# Patient Record
Sex: Female | Born: 1980 | Race: Black or African American | Hispanic: No | Marital: Single | Smoking: Former smoker
Health system: Southern US, Community
[De-identification: ages and names within clinical notes are randomized; demographics above are authoritative.]

## PROBLEM LIST (undated history)

## (undated) ENCOUNTER — Emergency Department (HOSPITAL_COMMUNITY): Disposition: A | Discharge status: Other Acute Inpatient Hospital | Payer: Medicaid Other

## (undated) HISTORY — PX: TUBAL LIGATION: SHX77

---
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
## 2002-06-13 ENCOUNTER — Emergency Department (HOSPITAL_COMMUNITY): Admission: EM | Admit: 2002-06-13 | Discharge: 2002-06-13 | Payer: Self-pay | Admitting: Emergency Medicine

## 2002-12-26 ENCOUNTER — Emergency Department (HOSPITAL_COMMUNITY): Admission: EM | Admit: 2002-12-26 | Discharge: 2002-12-26 | Payer: Self-pay | Admitting: *Deleted

## 2003-05-30 ENCOUNTER — Emergency Department (HOSPITAL_COMMUNITY): Admission: EM | Admit: 2003-05-30 | Discharge: 2003-05-30 | Payer: Self-pay | Admitting: Emergency Medicine

## 2003-06-17 ENCOUNTER — Emergency Department (HOSPITAL_COMMUNITY): Admission: EM | Admit: 2003-06-17 | Discharge: 2003-06-17 | Payer: Self-pay | Admitting: Emergency Medicine

## 2004-07-20 ENCOUNTER — Emergency Department (HOSPITAL_COMMUNITY): Admission: EM | Admit: 2004-07-20 | Discharge: 2004-07-20 | Payer: Self-pay | Admitting: Emergency Medicine

## 2004-12-10 ENCOUNTER — Ambulatory Visit (HOSPITAL_COMMUNITY): Admission: RE | Admit: 2004-12-10 | Discharge: 2004-12-11 | Payer: Self-pay | Admitting: Obstetrics and Gynecology

## 2005-01-04 ENCOUNTER — Inpatient Hospital Stay (HOSPITAL_COMMUNITY): Admission: AD | Admit: 2005-01-04 | Discharge: 2005-01-08 | Payer: Self-pay | Admitting: Obstetrics and Gynecology

## 2006-04-21 ENCOUNTER — Emergency Department (HOSPITAL_COMMUNITY): Admission: EM | Admit: 2006-04-21 | Discharge: 2006-04-21 | Payer: Self-pay | Admitting: Emergency Medicine

## 2006-07-25 ENCOUNTER — Inpatient Hospital Stay (HOSPITAL_COMMUNITY): Admission: AD | Admit: 2006-07-25 | Discharge: 2006-07-27 | Payer: Self-pay | Admitting: Obstetrics and Gynecology

## 2006-09-07 ENCOUNTER — Ambulatory Visit (HOSPITAL_COMMUNITY): Admission: RE | Admit: 2006-09-07 | Discharge: 2006-09-07 | Payer: Self-pay | Admitting: Obstetrics & Gynecology

## 2006-09-28 ENCOUNTER — Emergency Department (HOSPITAL_COMMUNITY): Admission: EM | Admit: 2006-09-28 | Discharge: 2006-09-28 | Payer: Self-pay | Admitting: Emergency Medicine

## 2007-10-30 ENCOUNTER — Emergency Department (HOSPITAL_COMMUNITY): Admission: EM | Admit: 2007-10-30 | Discharge: 2007-10-30 | Payer: Self-pay | Admitting: Emergency Medicine

## 2008-05-21 ENCOUNTER — Emergency Department (HOSPITAL_COMMUNITY): Admission: EM | Admit: 2008-05-21 | Discharge: 2008-05-21 | Payer: Self-pay | Admitting: Emergency Medicine

## 2008-12-05 ENCOUNTER — Emergency Department (HOSPITAL_COMMUNITY): Admission: EM | Admit: 2008-12-05 | Discharge: 2008-12-05 | Payer: Self-pay | Admitting: Emergency Medicine

## 2009-04-03 ENCOUNTER — Emergency Department (HOSPITAL_COMMUNITY): Admission: EM | Admit: 2009-04-03 | Discharge: 2009-04-03 | Payer: Self-pay | Admitting: Emergency Medicine

## 2009-09-10 ENCOUNTER — Emergency Department (HOSPITAL_COMMUNITY): Admission: EM | Admit: 2009-09-10 | Discharge: 2009-09-10 | Payer: Self-pay | Admitting: Emergency Medicine

## 2009-09-22 ENCOUNTER — Ambulatory Visit: Payer: Self-pay | Admitting: Orthopedic Surgery

## 2009-09-22 ENCOUNTER — Ambulatory Visit (HOSPITAL_COMMUNITY): Admission: RE | Admit: 2009-09-22 | Discharge: 2009-09-22 | Payer: Self-pay | Admitting: Orthopedic Surgery

## 2009-09-22 DIAGNOSIS — S139XXA Sprain of joints and ligaments of unspecified parts of neck, initial encounter: Secondary | ICD-10-CM

## 2009-09-22 DIAGNOSIS — M549 Dorsalgia, unspecified: Secondary | ICD-10-CM | POA: Insufficient documentation

## 2009-09-29 ENCOUNTER — Ambulatory Visit: Payer: Self-pay | Admitting: Orthopedic Surgery

## 2009-09-30 ENCOUNTER — Telehealth: Payer: Self-pay | Admitting: Orthopedic Surgery

## 2009-10-10 ENCOUNTER — Telehealth: Payer: Self-pay | Admitting: Orthopedic Surgery

## 2010-03-24 ENCOUNTER — Encounter: Payer: Self-pay | Admitting: Orthopedic Surgery

## 2010-08-11 NOTE — Letter (Signed)
Summary: Xray order  Xray order   Imported By: Cammie Sickle 09/22/2009 09:07:13  _____________________________________________________________________  External Attachment:    Type:   Image     Comment:   External Document

## 2010-08-11 NOTE — Progress Notes (Signed)
Summary: Chiropractor referral.  Phone Note Outgoing Call   Call placed by: Waldon Reining,  September 30, 2009 11:09 AM Call placed to: Specialist Action Taken: Information Sent Summary of Call: I faxed a referral for this patient to Dr. Reatha Harps, Chiropractor.
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
## 2010-08-11 NOTE — Progress Notes (Signed)
Summary: Patient cannot be reached by Chiropractor.  Phone Note From Other Clinic   Summary of Call: Misty Airel Magadan at Dr. Lubertha Basque offic  called to say she has not scheduled Donna Miranda because she has not been able to contat her.  Has left messages but, patient has not returned her calls Initial call taken by: Jacklynn Ganong,  October 10, 2009 8:48 AM

## 2010-08-11 NOTE — Miscellaneous (Signed)
Summary: denied pain med for the 3rd time in a week  Clinical Lists Changes

## 2010-08-11 NOTE — Letter (Signed)
Summary: History form  History form   Imported By: Jacklynn Ganong 09/30/2009 11:41:55  _____________________________________________________________________  External Attachment:    Type:   Image     Comment:   External Document
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
## 2010-10-16 LAB — URINALYSIS, ROUTINE W REFLEX MICROSCOPIC
Glucose, UA: NEGATIVE mg/dL
Leukocytes, UA: NEGATIVE
Nitrite: NEGATIVE
Specific Gravity, Urine: 1.03 (ref 1.005–1.030)

## 2010-10-16 LAB — WET PREP, GENITAL: Yeast Wet Prep HPF POC: NONE SEEN

## 2010-10-16 LAB — URINE MICROSCOPIC-ADD ON

## 2010-10-16 LAB — GC/CHLAMYDIA PROBE AMP, GENITAL: Chlamydia, DNA Probe: NEGATIVE

## 2010-11-27 NOTE — Op Note (Signed)
Donna Miranda, Donna Miranda                  ACCOUNT NO.:  [id number]   MEDICAL RECORD NO.:  [id number]          PATIENT TYPE:  INP   LOCATION:  LDR1                          FACILITY:  [facility]   PHYSICIAN:  Tilda Burrow, M.D. DATE OF BIRTH:  10-01-1980   DATE OF PROCEDURE:  07/25/2006  DATE OF DISCHARGE:                               OPERATIVE REPORT   PROCEDURE:  Continuous lumbar epidural catheter placement.   Patient requested epidural and shortly, at approximately 12:45 p.m., an  epidural was placed after confirming patient's request and absence of  any questions regarding the epidural, she had one with her prior  pregnancy.  Loss of resistance technique used at L2-3 interspace,  identifying the epidural space on the first attempt using a Tuohy needle  at a depth of approximately 4.5 cm where the epidural space was  encountered.  A 3 mL test dose of Xylocaine with epinephrine was infused  followed by insertion of the epidural catheter 3 cm into the epidural  space.  An additional 2 mL test dose is injected of Xylocaine with  epinephrine through the epidural catheter with no change in maternal  status.  We then proceeded with continuous infusion with a 7 mL bolus  and 14 mL per hour with symmetric analgesic effect at T10 near the  umbilicus. The patient tolerated the procedure well and went to recovery  room in excellent condition.      Tilda Burrow, M.D.  Electronically Signed     JVF/MEDQ  D:  07/25/2006  T:  07/25/2006  Job:  [id number]

## 2010-11-27 NOTE — Op Note (Signed)
NAMEOVIYA, AMMAR                  ACCOUNT NO.:  [id number]   MEDICAL RECORD NO.:  [id number]          PATIENT TYPE:  INP   LOCATION:  LDR1                          FACILITY:  [facility]   PHYSICIAN:  Tilda Burrow, M.D. DATE OF BIRTH:  [birth year]   DATE OF PROCEDURE:  01/05/2005  DATE OF DISCHARGE:                                 OPERATIVE REPORT   PROCEDURE PERFORMED:  Epidural catheter placement.   Continuous lumbar epidural catheter attempted with the patient 7 cm dilated,  requesting epidural.   DESCRIPTION OF PROCEDURE:  Consent was obtained and the patient was placed  in the sitting position, flexed forward with back prepped and draped.  A  skin wheal was raised at L3-4 interspace, and epidural catheter needle, a 19  gauge Tuohy needle inserted and loss of resistance technique used.  Unfortunately, a wet tap was obtained a very superficial distance and so  that catheter discontinued at that space.  We proceeded with raising to the  next interspace level and again after placing pressure on the skin at the  old site for a couple of minutes, proceeded with catheter placement using  the same technique at a higher interspace.  The epidural space was  identified using loss of resistance technique at 2 cm depth beneath the  skin.  A 3 mL test dose was initiated followed by insertion of the epidural  catheter 3 cm into the epidural space and then upward oriented position.  The catheter was attached to the infusion bag and a 9 mL bolus initiated  followed by 12 mL per hour.  Analgesic effect is setting up at the time of  this dictation.       JVF/MEDQ  D:  01/05/2005  T:  01/05/2005  Job:  [id number]

## 2010-11-27 NOTE — Discharge Summary (Signed)
Donna Miranda, Donna Miranda                  ACCOUNT NO.:  [id number]   MEDICAL RECORD NO.:  [id number]          PATIENT TYPE:  INP   LOCATION:                                FACILITY:  [facility]   PHYSICIAN:  Tilda Burrow, M.D. DATE OF BIRTH:  Apr 23, 1981   DATE OF ADMISSION:  01/04/2005  DATE OF DISCHARGE:  06/30/2006LH                                 DISCHARGE SUMMARY   ADMISSION DIAGNOSES:  1.  Pregnancy 40 weeks and 4 days.  2.  Induction of labor.   DISCHARGE DIAGNOSIS:  Pregnancy 40 weeks and 4 days, delivered.   PROCEDURE:  1.  Foley bulb cervical ripening.  2.  Continuous lumbar epidural catheter placement by Tilda Burrow, M.D.  3.  Spontaneous, vertex, vaginal delivery by Zerita Boers, N.M.   COMPLICATIONS:  Wet tap to epidural not requiring blood patch.   HOSPITAL SUMMARY:  This 30 year old female was admitted on 06/26/yr for  induction of labor at 40 weeks 4 days.  She had Foley bulb induction over  night, active labor in response to oxytocin.  The epidural catheter was  placed at approximately 7 a.m. on 01/05/2005 and was complicated by wet tap  due to the patient's unusually superficial subarachnoid space.  The patient  had an effective epidural for labor management.  She delivered after a 1  hour, 11 minute second stage and 10 minute third stage with epidural  catheter removed intact.  The baby was a healthy female infant who did well.  Postpartum course was notable and the patient chose against getting an  epidural blood patch for the headache.  She found the headache manageable  and after a lengthy discussion on date of discharge she chose to attempt  conservative management with increasing fluids, caffeine, and Tylox p.r.n.  pain and Ambien for sleep with the patient having plenty of support at home.  Discharge hemoglobin was 8.3, hematocrit 24.8.  The patient will be on  Chromatin Forte as well.  Followup in 1 week for Depo-Provera and assessment  of  headache.       JVF/MEDQ  D:  01/08/2005  T:  01/08/2005  Job:  [id number]

## 2010-11-27 NOTE — Op Note (Signed)
Donna Miranda, LAHMAN                  ACCOUNT NO.:  [id number]   MEDICAL RECORD NO.:  [id number]          PATIENT TYPE:  AMB   LOCATION:  DAY                           FACILITY:  [facility]   PHYSICIAN:  Lazaro Arms, M.D.   DATE OF BIRTH:  February 14, 1981   DATE OF PROCEDURE:  09/07/2006  DATE OF DISCHARGE:                               OPERATIVE REPORT   DISCHARGE DIAGNOSIS:  Multiparous female, desires permanent  sterilization.   POSTOPERATIVE DIAGNOSIS:  Multiparous female, desires permanent  sterilization.   PROCEDURE:  Laparoscopic tubal ligation using electrocautery.   SURGEON:  Eure.   ANESTHESIA:  General endotracheal.   FINDINGS:  Normal uterus, tubes and ovaries, and pelvis.   DESCRIPTION OF OPERATION:  The patient was taken to the operating room,  placed in supine position, underwent general endotracheal anesthesia.  Placed in dorsal lithotomy supine position, prepped and draped in  sterile fashion.  Incision made in the umbilicus.  Veress needle was  used, placed in the peritoneal cavity with one pass without difficulty.  Intraperitoneal cavity was insufflated.  A nonbladed video laparoscope  trocar was placed under direct visualization without difficulty.  Peritoneal cavity was confirmed.  The fallopian tubes were identified.  Both fallopian tubes were burned in the distal isthmic ampullary region  of each tube, 2.5-cm segments bilaterally.  There was good hemostasis  bilaterally.  Peritoneal contents were otherwise normal.  Pelvic  contents were normal.  The instruments was removed.  Gas was allowed to  escape.  Fascia was closed 0 Vicryl.  Subcuticular suture of 3-0 Vicryl  was placed.  Skin was closed using skin staples.  The patient tolerated  the procedure well.  She experienced minimal blood loss, taken to the  recovery room in good and stable condition.  All counts correct.      Lazaro Arms, M.D.  Electronically Signed     LHE/MEDQ  D:  09/07/2006  T:   09/07/2006  Job:  [id number]

## 2010-11-27 NOTE — H&P (Signed)
Donna Miranda, Donna Miranda                  ACCOUNT NO.:  [id number]   MEDICAL RECORD NO.:  [id number]           PATIENT TYPE:   LOCATION:                                FACILITY:  [facility]   PHYSICIAN:  Tilda Burrow, M.D. DATE OF BIRTH:  [birth year]   DATE OF ADMISSION:  01/04/2005  DATE OF DISCHARGE:  LH                                HISTORY & PHYSICAL   REASON FOR ADMISSION:  Pregnancy at 40 weeks and 4 days for induction of  labor.   MEDICAL HISTORY:  Negative.   SURGICAL HISTORY:  Negative.   ALLERGIES:  NO KNOWN ALLERGIES.   MEDICATIONS:  1.  Prenatal vitamins.  2.  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   FAMILY HISTORY:  Positive for hypertension and throat cancer.   PRENATAL COURSE:  Essentially uneventful.  She did have a positive Chlamydia  in December, which was treated with recurrent negative after that.  Blood  type is O positive, UDS is negative.  Sickle cell screen is negative.  Rubella is immune.  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  Serology is nonreactive.  Pap is normal.  The  last GC  and Chlamydia were negative.  MSAFP is normal.  A 28 week  hemoglobin was 8.7, 28 week hematocrit 28.6.  One hour glucose is 84.  GBS  is negative.  Repeat GC and Chlamydia are negative.   PHYSICAL EXAMINATION:  VITAL SIGNS:  Today weight is 142.  Blood pressure  114/70.  There is positive fetal movement.  Fetal heart rate is 130, strong  an regular.  ABDOMEN:  Fundal height is 35 cm, which is a 2 cm decrease since the last  visit.   PLAN:  We are going to admit for the above, induction of labor.       DL/MEDQ  D:  04/54/0981  T:  01/04/2005  Job:  [id number]

## 2010-11-27 NOTE — H&P (Signed)
NAMEDEBORH, PENSE                  ACCOUNT NO.:  [id number]   MEDICAL RECORD NO.:  [id number]          PATIENT TYPE:  INP   LOCATION:  LDR1                          FACILITY:  [facility]   PHYSICIAN:  Tilda Burrow, M.D. DATE OF BIRTH:  1981/05/01   DATE OF ADMISSION:  DATE OF DISCHARGE:  LH                              HISTORY & PHYSICAL   ADMISSION DIAGNOSIS:  Pregnancy 40-1/2 weeks' gestation. Cervical  favorability. Desire for elective induction.   HISTORY OF PRESENT ILLNESS:  This is a 30 year old female gravida 2,  para 1, AB 0, LMP 10/13/05 places EDC of 07/20/06 is now 40 weeks'  gestation. She is scheduled for induction next Monday at 40 weeks, 5  days after pregnancy course followed through our office. Pregnancy was  notable for blood type 0 positive, urine drug screen negative, Rubella  immune.  At present, hemoglobin 10, hematocrit 33 dropping to 9-1/2 and  29 due to patient noncompliance with iron and vitamins. She has  hepatitis, HIV, RPR, GC and Chlamydia all negative. Group B strep was  positive. Glucose _tolerance test__ shows 82 mg percent. She was  restarted on Chromogen Forte and then Repliva to address the anemia.   PAST MEDICAL HISTORY:  Benign.   SURGICAL HISTORY:  Negative.   ALLERGIES:  None.   SOCIAL HISTORY:  Single, lives with dad, baby's father Durenda Age is back  in the picture.   PHYSICAL EXAMINATION:  Height 5 feet 5 inches, weight 148, which is a 16-  pound weight gain. Fundal height 36 cm, estimated fetal weight 6 pounds.  Cervix: 2 cm, 50%, -2, vertex.   PLAN:  Pitocin after foley bulb cervical__ ripening on the 14th. Pitocin  induction on the 15th. Epidural planned.      Tilda Burrow, M.D.  Electronically Signed     JVF/MEDQ  D:  07/20/2006  T:  07/20/2006  Job:  [id number]

## 2010-11-27 NOTE — H&P (Signed)
NAMETIERRE, GERARD                  ACCOUNT NO.:  [id number]   MEDICAL RECORD NO.:  [id number]          PATIENT TYPE:  INP   LOCATION:  LDR1                          FACILITY:  [facility]   PHYSICIAN:  Tilda Burrow, M.D. DATE OF BIRTH:  [birth year]   DATE OF ADMISSION:  07/25/2006  DATE OF DISCHARGE:  LH                              HISTORY & PHYSICAL   ADDENDUM TO HISTORY AND PHYSICAL:   REASON FOR ADMISSION:  Pregnancy at 40-1/2 weeks in active labor, 4-cm  dilated.   HISTORY OF PRESENT ILLNESS:  Kaitlyn called this morning stating she was  having regular contractions.  She was for induction tonight, but she now  presents to the hospital in early active labor.      Zerita Boers, Lanier Clam      Tilda Burrow, M.D.  Electronically Signed    DL/MEDQ  D:  09/81/1914  T:  07/25/2006  Job:  [id number]   cc:   [facility]   Francoise Schaumann. Milford Cage DO, FAAP  Fax: [phone]

## 2010-11-27 NOTE — Consult Note (Signed)
NAMEJAYLEANA, Donna Miranda                  ACCOUNT NO.:  [id number]   MEDICAL RECORD NO.:  [id number]          PATIENT TYPE:  OIB   LOCATION:  A415                          FACILITY:  [facility]   PHYSICIAN:  Tilda Burrow, M.D. DATE OF BIRTH:  12/28/1980   DATE OF CONSULTATION:  DATE OF DISCHARGE:  12/11/2004                                   CONSULTATION   Donna Miranda came in last night about 11:30 and was complaining of some nausea and  vomiting.  She had no ketones in her urine, no uterine activity.  Cervix was  closed and thick.  She was not symptomatic once she got to the hospital.  Her blood sugar was 116.  She was given Phenergan 25 mg IM and sent home  without any problems in stable condition, and she left after midnight.       FC/MEDQ  D:  12/11/2004  T:  12/11/2004  Job:  [id number]   cc:   [facility]

## 2010-11-27 NOTE — Op Note (Signed)
NAMEKYLA, DUFFY                  ACCOUNT NO.:  [id number]   MEDICAL RECORD NO.:  [id number]          PATIENT TYPE:  INP   LOCATION:  LDR1                          FACILITY:  [facility]   PHYSICIAN:  Tilda Burrow, M.D. DATE OF BIRTH:  Aug 05, 1980   DATE OF PROCEDURE:  DATE OF DISCHARGE:                                PROCEDURE NOTE   DELIVERY SUMMARY:  Onset of labor was approximately 10 a.m. on July 25, 2006.   DATE OF DELIVERY:  July 25, 2006 at 14089.   Length of first stage labor 4 hours; length of second stage labor 8  minutes; length of third stage labor 4 minutes.   Nahm had a__  normal spontaneous vaginal delivery of a viable female  infant upon delivery of head, shoulders were rotated and the infant  slipped across the peroneal floor without any difficulty.  The infant  was thoroughly suctioned, dried, cord clamped and cut, to mother's  abdomen for newborn care.  Apgar's were 9 and 9.  Upon inspection,  perineum was noted to be intact.  Third stage of labor was actually  managed with 20 units of Pitocin and 1,000 cc of D-5 LR at a rapid rate.  Placenta was delivered spontaneously via Tomasa Blase mechanism.  Cord blood  and cord blood gas was obtained.   ESTIMATED BLOOD LOSS:  Approximately 400 cc.   Methergine _ 1 ampule (0.2 Milligram)___  was given prophylaxis to  prevent any further bleeding.  The patient and infant were stable and  transferred out to the postpartum unit in stable condition.      Zerita Boers, Lanier Clam      Tilda Burrow, M.D.  Electronically Signed    DL/MEDQ  D:  16/04/9603  T:  07/25/2006  Job:  [id number]   cc:   Francoise Schaumann. Raynelle Highland  Fax: [phone]   Tilda Burrow, M.D.  Fax: [phone]

## 2010-11-27 NOTE — Op Note (Signed)
[name], Donna Miranda                  ACCOUNT NO.:  [id number]   MEDICAL RECORD NO.:  [id number]          PATIENT TYPE:  INP   LOCATION:  LDR1                          FACILITY:  [facility]   PHYSICIAN:  Tilda Burrow, M.D. DATE OF BIRTH:  [birth year]   DATE OF PROCEDURE:  DATE OF DISCHARGE:                                  PROCEDURE NOTE   DELIVERY SUMMARY:  Onset labor is going to be January 05, 2005 at 2 a.m. Date  of delivery January 05, 2005 at 10:26 a.m. Length of first stage labor 10 hours  and 15 minutes. Length of second stage labor 1 hour and 11 minutes. Length  of third stage labor 10 minutes.   DELIVERY NOTE:  [name] had a normal spontaneous delivery of a viable female  infant, Apgars 9 and 9. On delivery, spontaneous rotation and delivery of  shoulders without complication. Infant was suctioned thoroughly, dried. On  delivery had good movement of all extremities, strong vigorous cry and  pinked up well. Cord was clamped, and infant _monitored__ for infant care by  nursing staff. Third stage of labor was actively managed with 20 units of  Pitocin and 7 cc of D5 LR at a rapid rate. Placenta was delivered  spontaneously via Tomasa Blase mechanism. Upon inspection, third vessel cord was  noted. Membranes were noted to be intact upon inspection. Estimated blood  loss approximately 300 cc. Epidural catheter was removed without difficulty.  Blue tip intact. Infant and mother stabilized and transversed up to the post  partum unit in stable condition.       DL/MEDQ  D:  81/19/1478  T:  01/05/2005  Job:  [id number]

## 2011-04-06 LAB — URINALYSIS, ROUTINE W REFLEX MICROSCOPIC
Bilirubin Urine: NEGATIVE
Glucose, UA: NEGATIVE
Hgb urine dipstick: NEGATIVE
Ketones, ur: NEGATIVE
Nitrite: NEGATIVE
Protein, ur: NEGATIVE
Specific Gravity, Urine: 1.02
Urobilinogen, UA: 0.2
pH: 7.5

## 2011-04-06 LAB — PREGNANCY, URINE: Preg Test, Ur: NEGATIVE

## 2011-04-13 LAB — URINALYSIS, ROUTINE W REFLEX MICROSCOPIC
Glucose, UA: NEGATIVE
Nitrite: NEGATIVE
Urobilinogen, UA: 0.2
pH: 6

## 2011-04-13 LAB — DIFFERENTIAL
Basophils Absolute: 0
Eosinophils Absolute: 0.2
Lymphocytes Relative: 30
Lymphs Abs: 1.8
Neutro Abs: 3.7

## 2011-04-13 LAB — URINE MICROSCOPIC-ADD ON

## 2011-04-13 LAB — CBC
Hemoglobin: 12.4
WBC: 6.1

## 2011-04-13 LAB — PREGNANCY, URINE: Preg Test, Ur: NEGATIVE

## 2012-10-03 ENCOUNTER — Encounter (HOSPITAL_COMMUNITY): Payer: Self-pay | Admitting: *Deleted

## 2012-10-03 ENCOUNTER — Emergency Department (HOSPITAL_COMMUNITY)
Admission: EM | Admit: 2012-10-03 | Discharge: 2012-10-03 | Disposition: A | Discharge status: Home / Self Care | Payer: Self-pay | Attending: Emergency Medicine | Admitting: Emergency Medicine

## 2012-10-03 DIAGNOSIS — R110 Nausea: Secondary | ICD-10-CM | POA: Insufficient documentation

## 2012-10-03 DIAGNOSIS — R51 Headache: Secondary | ICD-10-CM | POA: Insufficient documentation

## 2012-10-03 DIAGNOSIS — R55 Syncope and collapse: Secondary | ICD-10-CM | POA: Insufficient documentation

## 2012-10-03 DIAGNOSIS — H53149 Visual discomfort, unspecified: Secondary | ICD-10-CM | POA: Insufficient documentation

## 2012-10-03 DIAGNOSIS — F17200 Nicotine dependence, unspecified, uncomplicated: Secondary | ICD-10-CM | POA: Insufficient documentation

## 2012-10-03 MED ORDER — BUTALBITAL-APAP-CAFFEINE 50-325-40 MG PO TABS: 1.0000 | ORAL_TABLET | Freq: Four times a day (QID) | ORAL | Status: AC | PRN

## 2012-10-03 MED ORDER — ONDANSETRON 8 MG PO TBDP
8.0000 mg | ORAL_TABLET | Freq: Once | ORAL | Status: AC
  Administered 2012-10-03: 8 mg via ORAL
  Filled 2012-10-03: qty 1

## 2012-10-03 NOTE — ED Notes (Signed)
Headache lt side of head for 1 week,  Felt faint intermitently,  And shakey,  No HI, nausea earlier, none now.

## 2012-10-03 NOTE — Discharge Instructions (Signed)

## 2012-10-05 NOTE — ED Provider Notes (Signed)
History     CSN: [id number]  Arrival date & time 10/03/12  1243   First MD Initiated Contact with Patient 10/03/12 1307      Chief Complaint  Patient presents with  . Headache    (Consider location/radiation/quality/duration/timing/severity/associated sxs/prior treatment) Patient is a 32 y.o. female presenting with headaches. The history is provided by the patient.  Headache Pain location:  L parietal and L temporal Quality:  Dull Radiates to:  Does not radiate Severity currently:  0/10 Severity at highest:  9/10 Onset quality:  Gradual Duration:  1 week Timing:  Intermittent Progression:  Waxing and waning Chronicity:  Recurrent Similar to prior headaches: yes   Context: bright light and emotional stress   Relieved by:  NSAIDs Worsened by:  Light and activity Ineffective treatments:  None tried Associated symptoms: nausea, near-syncope and photophobia   Associated symptoms: no abdominal pain, no back pain, no blurred vision, no congestion, no dizziness, no pain, no facial pain, no fever, no focal weakness, no neck pain, no neck stiffness, no numbness, no seizures, no sinus pressure, no sore throat, no swollen glands, no syncope, no tingling and no vomiting     History reviewed. No pertinent past medical history.  Past Surgical History  Procedure Laterality Date  . Tubal ligation      Family History  Problem Relation Age of Onset  . Diabetes Mother   . Diabetes Father     History  Substance Use Topics  . Smoking status: Current Every Day Smoker    Types: Cigarettes  . Smokeless tobacco: Not on file  . Alcohol Use: Yes     Comment: rarely    OB History   Grav Para Term Preterm Abortions TAB SAB Ect Mult Living                  Review of Systems  Constitutional: Negative for fever, chills, activity change and appetite change.  HENT: Negative for congestion, sore throat, facial swelling, trouble swallowing, neck pain, neck stiffness and sinus pressure.    Eyes: Positive for photophobia. Negative for blurred vision, pain and visual disturbance.  Respiratory: Negative for shortness of breath.   Cardiovascular: Positive for near-syncope. Negative for chest pain and syncope.  Gastrointestinal: Positive for nausea. Negative for vomiting and abdominal pain.  Musculoskeletal: Negative for back pain.  Skin: Negative for rash and wound.  Neurological: Positive for headaches. Negative for dizziness, focal weakness, seizures, facial asymmetry, speech difficulty, weakness and numbness.  Psychiatric/Behavioral: Negative for confusion and decreased concentration.  All other systems reviewed and are negative.    Allergies  Review of patient's allergies indicates no known allergies.  Home Medications   Current Outpatient Rx  Name  Route  Sig  Dispense  Refill  . naproxen sodium (ALEVE) 220 MG tablet   Oral   Take 440 mg by mouth 2 (two) times daily as needed.         . butalbital-acetaminophen-caffeine (FIORICET) 50-325-40 MG per tablet   Oral   Take 1-2 tablets by mouth every 6 (six) hours as needed for headache.   20 tablet   0     BP 120/80  Pulse 79  Temp(Src) 98.5 F (36.9 C) (Oral)  Resp 18  Ht 5\' 7"  (1.702 m)  Wt 120 lb (54.432 kg)  BMI 18.79 kg/m2  SpO2 100%  LMP 09/14/2012  Physical Exam  Nursing note and vitals reviewed. Constitutional: She is oriented to person, place, and time. She appears well-developed and well-nourished.  No distress.  HENT:  Head: Normocephalic and atraumatic.  Mouth/Throat: Oropharynx is clear and moist.  Eyes: EOM are normal. Pupils are equal, round, and reactive to light.  Neck: Normal range of motion and phonation normal. Neck supple. No rigidity. No Brudzinski's sign and no Kernig's sign noted.  Cardiovascular: Normal rate, regular rhythm, normal heart sounds and intact distal pulses.   No murmur heard. Pulmonary/Chest: Effort normal and breath sounds normal.  Musculoskeletal: Normal range  of motion.  Neurological: She is alert and oriented to person, place, and time. No cranial nerve deficit or sensory deficit. She exhibits normal muscle tone. Coordination and gait normal.  Reflex Scores:      Tricep reflexes are 2+ on the right side and 2+ on the left side.      Bicep reflexes are 2+ on the right side and 2+ on the left side. Skin: Skin is warm and dry.    ED Course  Procedures (including critical care time)  Labs Reviewed - No data to display No results found.   1. Headache       MDM     Vitals , nursing notes considered.    Pt is well appearing, headache now resolved .  No focal neuro deficits, no meningeal signs.  Likely related to migraine.  HA similar to previous.  Pt agrees to close f/u with her PMD.  Also advised to return here if her sx's are worsening  Patient requesting d/c, stating she has to pick up her child.  Fioricet prescribed   The patient appears reasonably screened and/or stabilized for discharge and I doubt any other medical condition or other [facility] requiring further screening, evaluation, or treatment in the ED at this time prior to discharge.      Onyinyechi Huante L. Cariana Karge, PA-C 10/05/12 1401

## 2012-10-06 NOTE — ED Provider Notes (Signed)
Medical screening examination/treatment/procedure(s) were performed by non-physician practitioner and as supervising physician I was immediately available for consultation/collaboration.   Rozelle Caudle, MD 10/06/12 1524 

## 2014-10-09 ENCOUNTER — Emergency Department (HOSPITAL_COMMUNITY)
Admission: EM | Admit: 2014-10-09 | Discharge: 2014-10-09 | Disposition: A | Discharge status: Home / Self Care | Payer: Self-pay | Attending: Emergency Medicine | Admitting: Emergency Medicine

## 2014-10-09 ENCOUNTER — Encounter (HOSPITAL_COMMUNITY): Payer: Self-pay

## 2014-10-09 DIAGNOSIS — J069 Acute upper respiratory infection, unspecified: Secondary | ICD-10-CM | POA: Insufficient documentation

## 2014-10-09 DIAGNOSIS — Z720 Tobacco use: Secondary | ICD-10-CM | POA: Insufficient documentation

## 2014-10-09 MED ORDER — LORATADINE-PSEUDOEPHEDRINE ER 5-120 MG PO TB12: 1.0000 | ORAL_TABLET | Freq: Two times a day (BID) | ORAL | Status: DC

## 2014-10-09 MED ORDER — PROMETHAZINE-DM 6.25-15 MG/5ML PO SYRP: 5.0000 mL | ORAL_SOLUTION | ORAL | Status: DC

## 2014-10-09 MED ORDER — IBUPROFEN 600 MG PO TABS: 600.0000 mg | ORAL_TABLET | Freq: Four times a day (QID) | ORAL | Status: DC

## 2014-10-09 NOTE — ED Provider Notes (Signed)
CSN: [id number]     Arrival date & time 10/09/14  1042 History   First MD Initiated Contact with Patient 10/09/14 1201     Chief Complaint  Patient presents with  . Sore Throat     (Consider location/radiation/quality/duration/timing/severity/associated sxs/prior Treatment) Patient is a 34 y.o. female presenting with pharyngitis. The history is provided by the patient.  Sore Throat This is a new problem. The current episode started in the past 7 days. The problem occurs intermittently. The problem has been gradually worsening. Associated symptoms include chills, congestion, coughing and a fever. Pertinent negatives include no abdominal pain, arthralgias, chest pain, myalgias, neck pain or vomiting. The symptoms are aggravated by swallowing. She has tried NSAIDs (OTC medications) for the symptoms.    History reviewed. No pertinent past medical history. Past Surgical History  Procedure Laterality Date  . Tubal ligation     Family History  Problem Relation Age of Onset  . Diabetes Mother   . Diabetes Father    History  Substance Use Topics  . Smoking status: Current Every Day Smoker    Types: Cigarettes  . Smokeless tobacco: Not on file  . Alcohol Use: Yes     Comment: rarely   OB History    No data available     Review of Systems  Constitutional: Positive for fever and chills. Negative for activity change.       All ROS Neg except as noted in HPI  HENT: Positive for congestion. Negative for nosebleeds.   Eyes: Negative for photophobia and discharge.  Respiratory: Positive for cough. Negative for shortness of breath and wheezing.   Cardiovascular: Negative for chest pain and palpitations.  Gastrointestinal: Negative for vomiting, abdominal pain and blood in stool.  Genitourinary: Negative for dysuria, frequency and hematuria.  Musculoskeletal: Negative for myalgias, back pain, arthralgias and neck pain.  Skin: Negative.   Neurological: Negative for dizziness, seizures and  speech difficulty.  Psychiatric/Behavioral: Negative for hallucinations and confusion.      Allergies  Review of patient's allergies indicates no known allergies.  Home Medications   Prior to Admission medications   Medication Sig Start Date End Date Taking? Authorizing Provider  naproxen sodium (ALEVE) 220 MG tablet Take 440 mg by mouth 2 (two) times daily as needed.   Yes Historical Provider, MD  OVER THE COUNTER MEDICATION Take 2 capsules by mouth once as needed (cold and flu symptoms).   Yes Historical Provider, MD  Phenyleph-CPM-DM-APAP (ALKA-SELTZER PLUS COLD & FLU PO) Take 2 capsules by mouth every 6 (six) hours as needed (cold/flu symptoms).   Yes Historical Provider, MD   BP 124/83 mmHg  Pulse 91  Temp(Src) 99.1 F (37.3 C) (Oral)  Resp 18  Ht  (1.702 m)  Wt 168 lb (76.204 kg)  BMI 26.31 kg/m2  SpO2 100%  LMP 09/02/2014 Physical Exam  Constitutional: She is oriented to person, place, and time. She appears well-developed and well-nourished.  Non-toxic appearance.  HENT:  Head: Normocephalic.  Right Ear: Tympanic membrane and external ear normal.  Left Ear: Tympanic membrane and external ear normal.  Mouth/Throat: Oropharynx is clear and moist.  Nasal congestion present.  Eyes: EOM and lids are normal. Pupils are equal, round, and reactive to light.  Neck: Normal range of motion. Neck supple. Carotid bruit is not present.  Cardiovascular: Normal rate, regular rhythm, normal heart sounds, intact distal pulses and normal pulses.  Exam reveals no friction rub.   No murmur heard. Pulmonary/Chest: Breath sounds normal. No  respiratory distress. She has no wheezes. She has no rales.  Abdominal: Soft. Bowel sounds are normal. There is no tenderness. There is no guarding.  Musculoskeletal: Normal range of motion.  Lymphadenopathy:       Head (right side): No submandibular adenopathy present.       Head (left side): No submandibular adenopathy present.    She has no  cervical adenopathy.  Neurological: She is alert and oriented to person, place, and time. She has normal strength. No cranial nerve deficit or sensory deficit.  Skin: Skin is warm and dry. No rash noted.  Psychiatric: She has a normal mood and affect. Her speech is normal.  Nursing note and vitals reviewed.   ED Course  Procedures (including critical care time) Labs Review Labs Reviewed - No data to display  Imaging Review No results found.   EKG Interpretation None      MDM  Vital signs are non acute. Exam suggest URI. Rx for ibuprofen and promethazine cough medication given. Pt to use salt water gargles and a mask has been issued.   Final diagnoses:  None    *I have reviewed nursing notes, vital signs, and all appropriate lab and imaging results for this patient.[address]    Dwight Burdo, PA-C 10/09/14 1214  Zadie Rhineonald Wickline, MD 10/10/14 [phone]

## 2014-10-09 NOTE — Discharge Instructions (Signed)
Upper Respiratory Infection, Adult SALT WATER GARGLES MAY BE HELPFUL. USE MASK UNTIL SYMPTOMS RESOLVE. INCREASE FLUIDS, AND WASH HANDS FREQUENTLY.                                                                           An upper respiratory infection (URI) is also sometimes known as the common cold. The upper respiratory tract includes the nose, sinuses, throat, trachea, and bronchi. Bronchi are the airways leading to the lungs. Most people improve within 1 week, but symptoms can last up to 2 weeks. A residual cough may last even longer.  CAUSES Many different viruses can infect the tissues lining the upper respiratory tract. The tissues become irritated and inflamed and often become very moist. Mucus production is also common. A cold is contagious. You can easily spread the virus to others by oral contact. This includes kissing, sharing a glass, coughing, or sneezing. Touching your mouth or nose and then touching a surface, which is then touched by another person, can also spread the virus. SYMPTOMS  Symptoms typically develop 1 to 3 days after you come in contact with a cold virus. Symptoms vary from person to person. They may include:  Runny nose.  Sneezing.  Nasal congestion.  Sinus irritation.  Sore throat.  Loss of voice (laryngitis).  Cough.  Fatigue.  Muscle aches.  Loss of appetite.  Headache.  Low-grade fever. DIAGNOSIS  You might diagnose your own cold based on familiar symptoms, since most people get a cold 2 to 3 times a year. Your caregiver can confirm this based on your exam. Most importantly, your caregiver can check that your symptoms are not due to another disease such as strep throat, sinusitis, pneumonia, asthma, or epiglottitis. Blood tests, throat tests, and X-rays are not necessary to diagnose a common cold, but they may sometimes be helpful in excluding other more serious diseases. Your caregiver will decide if any further tests are required. RISKS AND  COMPLICATIONS  You may be at risk for a more severe case of the common cold if you smoke cigarettes, have chronic heart disease (such as heart failure) or lung disease (such as asthma), or if you have a weakened immune system. The very young and very old are also at risk for more serious infections. Bacterial sinusitis, middle ear infections, and bacterial pneumonia can complicate the common cold. The common cold can worsen asthma and chronic obstructive pulmonary disease (COPD). Sometimes, these complications can require emergency medical care and may be life-threatening. PREVENTION  The best way to protect against getting a cold is to practice good hygiene. Avoid oral or hand contact with people with cold symptoms. Wash your hands often if contact occurs. There is no clear evidence that vitamin C, vitamin E, echinacea, or exercise reduces the chance of developing a cold. However, it is always recommended to get plenty of rest and practice good nutrition. TREATMENT  Treatment is directed at relieving symptoms. There is no cure. Antibiotics are not effective, because the infection is caused by a virus, not by bacteria. Treatment may include:  Increased fluid intake. Sports drinks offer valuable electrolytes, sugars, and fluids.  Breathing heated mist or steam (vaporizer or shower).  Eating chicken soup or other  clear broths, and maintaining good nutrition.  Getting plenty of rest.  Using gargles or lozenges for comfort.  Controlling fevers with ibuprofen or acetaminophen as directed by your caregiver.  Increasing usage of your inhaler if you have asthma. Zinc gel and zinc lozenges, taken in the first 24 hours of the common cold, can shorten the duration and lessen the severity of symptoms. Pain medicines may help with fever, muscle aches, and throat pain. A variety of non-prescription medicines are available to treat congestion and runny nose. Your caregiver can make recommendations and may  suggest nasal or lung inhalers for other symptoms.  HOME CARE INSTRUCTIONS   Only take over-the-counter or prescription medicines for pain, discomfort, or fever as directed by your caregiver.  Use a warm mist humidifier or inhale steam from a shower to increase air moisture. This may keep secretions moist and make it easier to breathe.  Drink enough water and fluids to keep your urine clear or pale yellow.  Rest as needed.  Return to work when your temperature has returned to normal or as your caregiver advises. You may need to stay home longer to avoid infecting others. You can also use a face mask and careful hand washing to prevent spread of the virus. SEEK MEDICAL CARE IF:   After the first few days, you feel you are getting worse rather than better.  You need your caregiver's advice about medicines to control symptoms.  You develop chills, worsening shortness of breath, or brown or red sputum. These may be signs of pneumonia.  You develop yellow or brown nasal discharge or pain in the face, especially when you bend forward. These may be signs of sinusitis.  You develop a fever, swollen neck glands, pain with swallowing, or white areas in the back of your throat. These may be signs of strep throat. SEEK IMMEDIATE MEDICAL CARE IF:   You have a fever.  You develop severe or persistent headache, ear pain, sinus pain, or chest pain.  You develop wheezing, a prolonged cough, cough up blood, or have a change in your usual mucus (if you have chronic lung disease).  You develop sore muscles or a stiff neck. Document Released: 12/22/2000 Document Revised: 09/20/2011 Document Reviewed: 10/03/2013 [facility] Patient Information 2015 [place], [place]. This information is not intended to replace advice given to you by your health care provider. Make sure you discuss any questions you have with your health care provider.

## 2014-10-09 NOTE — ED Notes (Signed)
Pt c/o cough, fever, and sore throat x 3 days.

## 2015-02-03 ENCOUNTER — Emergency Department (HOSPITAL_COMMUNITY)
Admission: EM | Admit: 2015-02-03 | Discharge: 2015-02-03 | Disposition: A | Discharge status: Home / Self Care | Payer: Medicaid Other | Attending: Emergency Medicine | Admitting: Emergency Medicine

## 2015-02-03 ENCOUNTER — Encounter (HOSPITAL_COMMUNITY): Payer: Self-pay | Admitting: *Deleted

## 2015-02-03 DIAGNOSIS — H109 Unspecified conjunctivitis: Secondary | ICD-10-CM | POA: Diagnosis not present

## 2015-02-03 DIAGNOSIS — Z87891 Personal history of nicotine dependence: Secondary | ICD-10-CM | POA: Insufficient documentation

## 2015-02-03 DIAGNOSIS — H5712 Ocular pain, left eye: Secondary | ICD-10-CM | POA: Diagnosis present

## 2015-02-03 DIAGNOSIS — Z79899 Other long term (current) drug therapy: Secondary | ICD-10-CM | POA: Insufficient documentation

## 2015-02-03 MED ORDER — TOBRAMYCIN 0.3 % OP SOLN
2.0000 [drp] | Freq: Once | OPHTHALMIC | Status: AC
  Administered 2015-02-03: 2 [drp] via OPHTHALMIC
  Filled 2015-02-03: qty 5

## 2015-02-03 NOTE — Discharge Instructions (Signed)
Your examination is consistent with conjunctivitis. Please wash hands frequently. Please wash surfaces frequently, as this is very contagious. Today please use 2 drops of the tobramycin ophthalmic solution in the left eye every 4 hours for the next 5 days. Please see the eye specialist listed above, or the eye specialist of your choice if not improving. Conjunctivitis Conjunctivitis is commonly called "pink eye." Conjunctivitis can be caused by bacterial or viral infection, allergies, or injuries. There is usually redness of the lining of the eye, itching, discomfort, and sometimes discharge. There may be deposits of matter along the eyelids. A viral infection usually causes a watery discharge, while a bacterial infection causes a yellowish, thick discharge. Pink eye is very contagious and spreads by direct contact. You may be given antibiotic eyedrops as part of your treatment. Before using your eye medicine, remove all drainage from the eye by washing gently with warm water and cotton balls. Continue to use the medication until you have awakened 2 mornings in a row without discharge from the eye. Do not rub your eye. This increases the irritation and helps spread infection. Use separate towels from other household members. Wash your hands with soap and water before and after touching your eyes. Use cold compresses to reduce pain and sunglasses to relieve irritation from light. Do not wear contact lenses or wear eye makeup until the infection is gone. SEEK MEDICAL CARE IF:   Your symptoms are not better after 3 days of treatment.  You have increased pain or trouble seeing.  The outer eyelids become very red or swollen. Document Released: 08/05/2004 Document Revised: 09/20/2011 Document Reviewed: 06/28/2005 [facility] Patient Information 2015 [place], [place]. This information is not intended to replace advice given to you by your health care provider. Make sure you discuss any questions you have with your  health care provider.

## 2015-02-03 NOTE — ED Provider Notes (Signed)
CSN: [id number]     Arrival date & time 02/03/15  1812 History   First MD Initiated Contact with Patient 02/03/15 1919     Chief Complaint  Patient presents with  . Eye Pain     (Consider location/radiation/quality/duration/timing/severity/associated sxs/prior Treatment) Patient is a 34 y.o. female presenting with eye pain. The history is provided by the patient.  Eye Pain This is a new problem. The current episode started today. The problem occurs intermittently. The problem has been gradually worsening. Associated symptoms include headaches. Pertinent negatives include no fever or visual change. Exacerbated by: bright light ands. She has tried nothing for the symptoms. The treatment provided no relief.    History reviewed. No pertinent past medical history. Past Surgical History  Procedure Laterality Date  . Tubal ligation     Family History  Problem Relation Age of Onset  . Diabetes Mother   . Diabetes Father    History  Substance Use Topics  . Smoking status: Former Smoker    Types: Cigarettes  . Smokeless tobacco: Not on file  . Alcohol Use: No     Comment: rarely   OB History    No data available     Review of Systems  Constitutional: Negative for fever.  Eyes: Positive for pain.  Neurological: Positive for headaches.  All other systems reviewed and are negative.     Allergies  Review of patient's allergies indicates no known allergies.  Home Medications   Prior to Admission medications   Medication Sig Start Date End Date Taking? Authorizing Provider  ibuprofen (ADVIL,MOTRIN) 600 MG tablet Take 1 tablet (600 mg total) by mouth 4 (four) times daily. 10/09/14   Ivery Quale, PA-C  loratadine-pseudoephedrine (CLARITIN-D 12 HOUR) 5-120 MG per tablet Take 1 tablet by mouth 2 (two) times daily. 10/09/14   Ivery Quale, PA-C  naproxen sodium (ALEVE) 220 MG tablet Take 440 mg by mouth 2 (two) times daily as needed.    Historical Provider, MD  OVER THE COUNTER  MEDICATION Take 2 capsules by mouth once as needed (cold and flu symptoms).    Historical Provider, MD  Phenyleph-CPM-DM-APAP (ALKA-SELTZER PLUS COLD & FLU PO) Take 2 capsules by mouth every 6 (six) hours as needed (cold/flu symptoms).    Historical Provider, MD  promethazine-dextromethorphan (PROMETHAZINE-DM) 6.25-15 MG/5ML syrup Take 5 mLs by mouth every 4 (four) hours. 10/09/14   Ivery Quale, PA-C   BP 118/88 mmHg  Pulse 74  Temp(Src) 98.6 F (37 C) (Oral)  Resp 20  Ht  (1.702 m)  Wt 178 lb 12.8 oz (81.103 kg)  BMI 28.00 kg/m2  SpO2 100%  LMP 01/26/2015 Physical Exam  Constitutional: She is oriented to person, place, and time. She appears well-developed and well-nourished.  Non-toxic appearance.  HENT:  Head: Normocephalic.  Right Ear: Tympanic membrane and external ear normal.  Left Ear: Tympanic membrane and external ear normal.  Eyes: EOM are normal. Pupils are equal, round, and reactive to light. Right eye exhibits no hordeolum. No foreign body present in the right eye. Left eye exhibits discharge. Left eye exhibits no hordeolum. No foreign body present in the left eye. Right conjunctiva is not injected. Left conjunctiva is injected. No scleral icterus.  Neck: Normal range of motion. Neck supple. Carotid bruit is not present.  Cardiovascular: Normal rate, regular rhythm, normal heart sounds, intact distal pulses and normal pulses.   Pulmonary/Chest: Breath sounds normal. No respiratory distress.  Abdominal: Soft. Bowel sounds are normal. There is no tenderness.  There is no guarding.  Musculoskeletal: Normal range of motion.  Lymphadenopathy:       Head (right side): No submandibular adenopathy present.       Head (left side): No submandibular adenopathy present.    She has no cervical adenopathy.  Neurological: She is alert and oriented to person, place, and time. She has normal strength. No cranial nerve deficit or sensory deficit.  Skin: Skin is warm and dry.   Psychiatric: She has a normal mood and affect. Her speech is normal.  Nursing note and vitals reviewed.   ED Course  Procedures (including critical care time) Labs Review Labs Reviewed - No data to display  Imaging Review No results found.   EKG Interpretation None      MDM  Vital signs are well within normal limits. Pulse oximetry is 100% on room air. The examination is consistent with conjunctivitis. The patient is provided tobramycin eyedrops. The patient will also use cool compresses. Patient is to see ophthalmology for additional evaluation if not improving.    Final diagnoses:  Conjunctivitis of left eye    *I have reviewed nursing notes, vital signs, and all appropriate lab and imaging results for this patient.[address]    Ivery Quale, PA-C 02/03/15 2003  Eber Hong, MD 02/03/15 [phone]

## 2015-02-03 NOTE — ED Notes (Signed)
Pt with left eye pain, states she went to rub it and had immediate pain

## 2015-02-03 NOTE — ED Notes (Signed)
Discharge instructions given - verbalized understanding - Also instructed on eye drop administration , and instructions written on D/C paper work -

## 2015-06-18 ENCOUNTER — Emergency Department (HOSPITAL_COMMUNITY)
Admission: EM | Admit: 2015-06-18 | Discharge: 2015-06-18 | Disposition: A | Discharge status: Home / Self Care | Payer: Medicaid Other | Attending: Emergency Medicine | Admitting: Emergency Medicine

## 2015-06-18 ENCOUNTER — Encounter (HOSPITAL_COMMUNITY): Payer: Self-pay

## 2015-06-18 DIAGNOSIS — Z791 Long term (current) use of non-steroidal anti-inflammatories (NSAID): Secondary | ICD-10-CM | POA: Diagnosis not present

## 2015-06-18 DIAGNOSIS — J029 Acute pharyngitis, unspecified: Secondary | ICD-10-CM | POA: Diagnosis present

## 2015-06-18 DIAGNOSIS — J069 Acute upper respiratory infection, unspecified: Secondary | ICD-10-CM | POA: Insufficient documentation

## 2015-06-18 DIAGNOSIS — H6502 Acute serous otitis media, left ear: Secondary | ICD-10-CM | POA: Diagnosis not present

## 2015-06-18 DIAGNOSIS — Z87891 Personal history of nicotine dependence: Secondary | ICD-10-CM | POA: Insufficient documentation

## 2015-06-18 LAB — RAPID STREP SCREEN (MED CTR MEBANE ONLY): Streptococcus, Group A Screen (Direct): NEGATIVE

## 2015-06-18 MED ORDER — AMOXICILLIN 500 MG PO CAPS: 500.0000 mg | ORAL_CAPSULE | Freq: Three times a day (TID) | ORAL | Status: DC

## 2015-06-18 MED ORDER — GUAIFENESIN 100 MG/5ML PO SYRP: 100.0000 mg | ORAL_SOLUTION | ORAL | Status: DC | PRN

## 2015-06-18 NOTE — ED Notes (Signed)
Pt reports sore throat and left earache for past 4 or 5 days.

## 2015-06-18 NOTE — ED Provider Notes (Signed)
CSN: [id number]     Arrival date & time 06/18/15  1434 History   First MD Initiated Contact with Patient 06/18/15 1556     Chief Complaint  Patient presents with  . Sore Throat     (Consider location/radiation/quality/duration/timing/severity/associated sxs/prior Treatment) Patient is a 34 y.o. female presenting with pharyngitis. The history is provided by the patient.  Sore Throat This is a new problem. The current episode started in the past 7 days. The problem has been gradually worsening. Associated symptoms include chills, congestion and a sore throat.   Donna Miranda is a 34 y.o. female who presents to the ED with sore throat, chills, congestion and ear ache. The symptoms started 5 days ago and have gotten worse. She reports symptoms started with conjunctivitis of the left eye and congestion but after using antibiotic eye drops the eye has gotten better but the other symptoms are worse.   History reviewed. No pertinent past medical history. Past Surgical History  Procedure Laterality Date  . Tubal ligation     Family History  Problem Relation Age of Onset  . Diabetes Mother   . Diabetes Father    Social History  Substance Use Topics  . Smoking status: Former Smoker    Types: Cigarettes  . Smokeless tobacco: None  . Alcohol Use: No     Comment: rarely   OB History    No data available     Review of Systems  Constitutional: Positive for chills.  HENT: Positive for congestion, ear pain and sore throat.   all other systems negative    Allergies  Review of patient's allergies indicates no known allergies.  Home Medications   Prior to Admission medications   Medication Sig Start Date End Date Taking? Authorizing Provider  Aspirin-Acetaminophen-Caffeine (EXCEDRIN EXTRA STRENGTH PO) Take 1-2 tablets by mouth daily as needed (for pain).   Yes Historical Provider, MD  ibuprofen (ADVIL,MOTRIN) 600 MG tablet Take 1 tablet (600 mg total) by mouth 4 (four) times daily.  10/09/14  Yes Ivery Quale, PA-C  Phenyleph-CPM-DM-APAP (ALKA-SELTZER PLUS COLD & FLU PO) Take 2 capsules by mouth every 6 (six) hours as needed (cold/flu symptoms).   Yes Historical Provider, MD  Pseudoeph-Doxylamine-DM-APAP (NYQUIL PO) Take 15 mLs by mouth daily as needed (for cold and flu symptoms).    Yes Historical Provider, MD  amoxicillin (AMOXIL) 500 MG capsule Take 1 capsule (500 mg total) by mouth 3 (three) times daily. 06/18/15   Britain Saber Orlene Och, NP  guaifenesin (ROBITUSSIN) 100 MG/5ML syrup Take 5-10 mLs (100-200 mg total) by mouth every 4 (four) hours as needed for cough. 06/18/15   Charmian Forbis Orlene Och, NP   BP 136/93 mmHg  Pulse 98  Temp(Src) 98.9 F (37.2 C) (Oral)  Resp 18  Ht  (1.702 m)  Wt 80.74 kg  BMI 27.87 kg/m2  SpO2 97%  LMP 05/29/2015 Physical Exam  Constitutional: She is oriented to person, place, and time. She appears well-developed and well-nourished. No distress.  HENT:  Head: Normocephalic and atraumatic.  Right Ear: Tympanic membrane normal.  Left Ear: Tympanic membrane is erythematous.  Mouth/Throat: Uvula is midline and mucous membranes are normal. Posterior oropharyngeal erythema present.  Eyes: EOM are normal. Left conjunctiva is injected.  Neck: Neck supple.  Cardiovascular: Normal rate and regular rhythm.   Pulmonary/Chest: Effort normal and breath sounds normal. She has no wheezes. She has no rales.  Abdominal: Soft. There is no tenderness.  Musculoskeletal: Normal range of motion.  Lymphadenopathy:  She has cervical adenopathy.  Neurological: She is alert and oriented to person, place, and time. No cranial nerve deficit.  Skin: Skin is warm and dry.  Psychiatric: She has a normal mood and affect. Her behavior is normal.  Nursing note and vitals reviewed.   ED Course  Procedures (including critical care time) Labs Review Results for orders placed or performed during the hospital encounter of 06/18/15 (from the past 24 hour(s))  Rapid strep  screen     Status: None   Collection Time: 06/18/15  4:00 PM  Result Value Ref Range   Streptococcus, Group A Screen (Direct) NEGATIVE NEGATIVE      MDM  34 y.o. female with ear pain cough and congestion and sore throat x 5 days stable for d/c without fever at this time, negative strep screen and does not appear toxic. Will treat with antibiotics for otitis media, Robitussin for cough and she will follow up with her PCP in one week to recheck the ear. Discussed with the patient clinical findings and plan of care.All questioned fully answered. She will return if any problems arise.   Final diagnoses:  Acute serous otitis media of left ear, recurrence not specified  URI (upper respiratory infection)        Janne NapoleonHope M Shantese Raven, NP 06/19/15 0128  Derwood KaplanAnkit Nanavati, MD 06/19/15 1819

## 2015-06-18 NOTE — Discharge Instructions (Signed)
Cool Mist Vaporizers  Vaporizers may help relieve the symptoms of a cough and cold. They add moisture to the air, which helps mucus to become thinner and less sticky. This makes it easier to breathe and cough up secretions. Cool mist vaporizers do not cause serious burns like hot mist vaporizers, which may also be called steamers or humidifiers. Vaporizers have not been proven to help with colds. You should not use a vaporizer if you are allergic to mold.  HOME CARE INSTRUCTIONS  · Follow the package instructions for the vaporizer.  · Do not use anything other than distilled water in the vaporizer.  · Do not run the vaporizer all of the time. This can cause mold or bacteria to grow in the vaporizer.  · Clean the vaporizer after each time it is used.  · Clean and dry the vaporizer well before storing it.  · Stop using the vaporizer if worsening respiratory symptoms develop.     This information is not intended to replace advice given to you by your health care provider. Make sure you discuss any questions you have with your health care provider.     Document Released: 03/25/2004 Document Revised: 07/03/2013 Document Reviewed: 11/15/2012  Elsevier Interactive Patient Education ©2016 [employer]

## 2015-06-20 LAB — CULTURE, GROUP A STREP: STREP A CULTURE: NEGATIVE

## 2017-01-17 ENCOUNTER — Emergency Department (HOSPITAL_COMMUNITY)
Admission: EM | Admit: 2017-01-17 | Discharge: 2017-01-17 | Disposition: A | Discharge status: Home / Self Care | Payer: Medicaid Other | Attending: Emergency Medicine | Admitting: Emergency Medicine

## 2017-01-17 ENCOUNTER — Encounter (HOSPITAL_COMMUNITY): Payer: Self-pay | Admitting: Emergency Medicine

## 2017-01-17 DIAGNOSIS — F17210 Nicotine dependence, cigarettes, uncomplicated: Secondary | ICD-10-CM | POA: Diagnosis not present

## 2017-01-17 DIAGNOSIS — R519 Headache, unspecified: Secondary | ICD-10-CM

## 2017-01-17 DIAGNOSIS — R1110 Vomiting, unspecified: Secondary | ICD-10-CM | POA: Insufficient documentation

## 2017-01-17 DIAGNOSIS — R51 Headache: Secondary | ICD-10-CM | POA: Insufficient documentation

## 2017-01-17 MED ORDER — KETOROLAC TROMETHAMINE 30 MG/ML IJ SOLN
30.0000 mg | Freq: Once | INTRAMUSCULAR | Status: AC
  Administered 2017-01-17: 30 mg via INTRAVENOUS
  Filled 2017-01-17: qty 1

## 2017-01-17 MED ORDER — SODIUM CHLORIDE 0.9 % IV BOLUS (SEPSIS)
1000.0000 mL | Freq: Once | INTRAVENOUS | Status: AC
  Administered 2017-01-17: 1000 mL via INTRAVENOUS

## 2017-01-17 MED ORDER — METOCLOPRAMIDE HCL 5 MG/ML IJ SOLN
10.0000 mg | Freq: Once | INTRAMUSCULAR | Status: AC
  Administered 2017-01-17: 10 mg via INTRAVENOUS
  Filled 2017-01-17: qty 2

## 2017-01-17 MED ORDER — DIPHENHYDRAMINE HCL 50 MG/ML IJ SOLN
25.0000 mg | Freq: Once | INTRAMUSCULAR | Status: AC
  Administered 2017-01-17: 25 mg via INTRAVENOUS
  Filled 2017-01-17: qty 1

## 2017-01-17 NOTE — Discharge Instructions (Signed)
Rest. Follow-up your primary care doctor.

## 2017-01-17 NOTE — ED Triage Notes (Signed)
Pt reports R sided HA started at 1430, has had 3 episodes of V/ and D/ since then. Denies abd pain at this time. States she feels dizzy, worse with movement.

## 2017-01-18 NOTE — ED Provider Notes (Signed)
[facility]-EMERGENCY DEPT Provider Note   CSN: [id number] Arrival date & time: 01/17/17  2100     History   Chief Complaint Chief Complaint  Patient presents with  . Headache  . Emesis    HPI Donna Miranda is a [age] y.o. female.  Patient complains of right frontal headache since 2:30 PM today with nausea and vomiting. She feels slightly dizzy with movement. No gross neurological deficits or neck pain. Remote history of headaches. Severity is mild. Nothing makes symptoms better or worse.      History reviewed. No pertinent past medical history.  Patient Active Problem List   Diagnosis Date Noted  . BACK PAIN 09/22/2009  . CERVICAL SPASM 09/22/2009    Past Surgical History:  Procedure Laterality Date  . TUBAL LIGATION      OB History    No data available       Home Medications    Prior to Admission medications   Medication Sig Start Date End Date Taking? Authorizing Provider  diphenhydramine-acetaminophen (TYLENOL PM) 25-500 MG TABS tablet Take 1-2 tablets by mouth at bedtime as needed.   Yes [provider]    Family History Family History  Problem Relation Age of Onset  . Diabetes Mother   . Diabetes Father     Social History Social History  Substance Use Topics  . Smoking status: Current Every Day Smoker    Packs/day: 0.50    Types: Cigarettes  . Smokeless tobacco: Never Used  . Alcohol use Yes     Comment: rarely     Allergies   Patient has no known allergies.   Review of Systems Review of Systems  All other systems reviewed and are negative.    Physical Exam Updated Vital Signs BP (!) 136/97 (BP Location: Right Arm)   Pulse 65   Temp 98.4 F (36.9 C) (Oral)   Resp 18   Ht 5\' 7"  (1.702 m)   Wt 77.1 kg (170 lb)   LMP 01/13/2017   SpO2 100%   BMI 26.63 kg/m   Physical Exam  Constitutional: She is oriented to person, place, and time. She appears well-developed and well-nourished.  HENT:  Head: Normocephalic and atraumatic.   Eyes: Conjunctivae are normal.  Neck: Neck supple.  Cardiovascular: Normal rate and regular rhythm.   Pulmonary/Chest: Effort normal and breath sounds normal.  Abdominal: Soft. Bowel sounds are normal.  Musculoskeletal: Normal range of motion.  Neurological: She is alert and oriented to person, place, and time.  Skin: Skin is warm and dry.  Psychiatric: She has a normal mood and affect. Her behavior is normal.  Nursing note and vitals reviewed.    ED Treatments / Results  Labs (all labs ordered are listed, but only abnormal results are displayed) Labs Reviewed - No data to display  EKG  EKG Interpretation None       Radiology No results found.  Procedures Procedures (including critical care time)  Medications Ordered in ED Medications  sodium chloride 0.9 % bolus 1,000 mL (0 mLs Intravenous Stopped 01/17/17 2324)  ketorolac (TORADOL) 30 MG/ML injection 30 mg (30 mg Intravenous Given 01/17/17 2157)  metoCLOPramide (REGLAN) injection 10 mg (10 mg Intravenous Given 01/17/17 2158)  diphenhydrAMINE (BENADRYL) injection 25 mg (25 mg Intravenous Given 01/17/17 2157)     Initial Impression / Assessment and Plan / ED Course  I have reviewed the triage vital signs and the nursing notes.  Pertinent labs & imaging results that were available during my care  of the patient were reviewed by me and considered in my medical decision making (see chart for details).     Patient has a benign exam. She feels better after IV fluids, IV Toradol, IV Reglan, IV Benadryl.  Final Clinical Impressions(s) / ED Diagnoses   Final diagnoses:  Acute intractable headache, unspecified headache type    New Prescriptions Discharge Medication List as of 01/17/2017 11:09 PM       Donnetta Hutching, MD 01/19/17 1700

## 2017-05-15 ENCOUNTER — Emergency Department (HOSPITAL_COMMUNITY)
Admission: EM | Admit: 2017-05-15 | Discharge: 2017-05-16 | Disposition: A | Discharge status: Home / Self Care | Payer: Medicaid Other | Attending: Emergency Medicine | Admitting: Emergency Medicine

## 2017-05-15 ENCOUNTER — Encounter (HOSPITAL_COMMUNITY): Payer: Self-pay | Admitting: *Deleted

## 2017-05-15 DIAGNOSIS — Z79899 Other long term (current) drug therapy: Secondary | ICD-10-CM | POA: Diagnosis not present

## 2017-05-15 DIAGNOSIS — R112 Nausea with vomiting, unspecified: Secondary | ICD-10-CM | POA: Insufficient documentation

## 2017-05-15 DIAGNOSIS — E876 Hypokalemia: Secondary | ICD-10-CM | POA: Diagnosis not present

## 2017-05-15 DIAGNOSIS — F17210 Nicotine dependence, cigarettes, uncomplicated: Secondary | ICD-10-CM | POA: Diagnosis not present

## 2017-05-15 LAB — CBC WITH DIFFERENTIAL/PLATELET
Basophils Absolute: 0 10*3/uL (ref 0.0–0.1)
Basophils Relative: 0 %
EOS ABS: 0.1 10*3/uL (ref 0.0–0.7)
EOS PCT: 2 %
HCT: 37.2 % (ref 36.0–46.0)
Hemoglobin: 12.2 g/dL (ref 12.0–15.0)
LYMPHS ABS: 2.8 10*3/uL (ref 0.7–4.0)
LYMPHS PCT: 41 %
MCH: 27.4 pg (ref 26.0–34.0)
MCHC: 32.8 g/dL (ref 30.0–36.0)
MCV: 83.6 fL (ref 78.0–100.0)
MONOS PCT: 7 %
Monocytes Absolute: 0.5 10*3/uL (ref 0.1–1.0)
Neutro Abs: 3.4 10*3/uL (ref 1.7–7.7)
Neutrophils Relative %: 50 %
PLATELETS: 261 10*3/uL (ref 150–400)
RBC: 4.45 MIL/uL (ref 3.87–5.11)
RDW: 14.4 % (ref 11.5–15.5)
WBC: 6.8 10*3/uL (ref 4.0–10.5)

## 2017-05-15 MED ORDER — ONDANSETRON HCL 4 MG/2ML IJ SOLN
4.0000 mg | Freq: Once | INTRAMUSCULAR | Status: AC
  Administered 2017-05-15: 4 mg via INTRAVENOUS
  Filled 2017-05-15: qty 2

## 2017-05-15 MED ORDER — SODIUM CHLORIDE 0.9 % IV BOLUS (SEPSIS)
1000.0000 mL | Freq: Once | INTRAVENOUS | Status: AC
  Administered 2017-05-16: 1000 mL via INTRAVENOUS

## 2017-05-15 MED ORDER — SODIUM CHLORIDE 0.9 % IV BOLUS (SEPSIS)
1000.0000 mL | Freq: Once | INTRAVENOUS | Status: AC
  Administered 2017-05-15: 1000 mL via INTRAVENOUS

## 2017-05-15 NOTE — ED Triage Notes (Signed)
Pt with nausea and vomiting starting today x 3 . Denies diarrhea.  Pt laid down earlier with mild lightheadedness, very mild at present.

## 2017-05-15 NOTE — ED Provider Notes (Signed)
[facility] EMERGENCY DEPARTMENT Provider Note   CSN: [id number] Arrival date & time: 05/15/17  2233  Time seen 23:18 PM    History   Chief Complaint Chief Complaint  Patient presents with  . Emesis    HPI Donna Miranda is a 36 y.o. female.  HPI patient states she has been having nausea and vomiting off and on for the past few weeks.  She states it can vary from no episodes of vomiting a day up to 4 episodes a day.  She states one time she saw some specks of blood in her vomitus.  She denies that the vomitus taste or that she has a burning sensation in her throat or abdomen.  She denies any abdominal pain, diarrhea, fever, or urinary symptoms.  She does state however tonight she is urinating more frequently than normal.  She states she had felt fine all day.  She laid down at 9:00 to go to sleep but states "my mind would not stop racing".  Then about 9:30 PM she had acute onset of nausea with vomiting x3.  Denies eating anything different, she denies being around anybody else who is ill.  She states just prior to getting the vomiting she feels dizzy and lightheaded.  She states she has a history of anxiety but does not feel like this is an anxiety attack.  Patient is G2P2 Ab0, states her last normal period was a few days early and started on October 23.  PCP none  History reviewed. No pertinent past medical history.  Patient Active Problem List   Diagnosis Date Noted  . BACK PAIN 09/22/2009  . CERVICAL SPASM 09/22/2009    Past Surgical History:  Procedure Laterality Date  . TUBAL LIGATION      OB History    No data available       Home Medications    Prior to Admission medications   Medication Sig Start Date End Date Taking? Authorizing Provider  diphenhydramine-acetaminophen (TYLENOL PM) 25-500 MG TABS tablet Take 1-2 tablets by mouth at bedtime as needed.    [provider]  ondansetron (ZOFRAN) 4 MG tablet Take 1 tablet (4 mg total) every 8 (eight) hours as  needed by mouth. 05/16/17   Devoria Albe, MD    Family History Family History  Problem Relation Age of Onset  . Diabetes Mother   . Diabetes Father     Social History Social History   Tobacco Use  . Smoking status: Current Every Day Smoker    Packs/day: 0.50    Types: Cigarettes  . Smokeless tobacco: Never Used  Substance Use Topics  . Alcohol use: Yes    Comment: rarely  . 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   Review of Systems Review of Systems  All other systems reviewed and are negative.    Physical Exam Updated Vital Signs BP 126/81   Pulse 71   Temp 98.2 F (36.8 C) (Oral)   Resp 17   Ht 5\' 7"  (1.702 m)   Wt 72.6 kg (160 lb)   LMP 05/03/2017   SpO2 100%   BMI 25.06 kg/m   Vital signs normal    Physical Exam  Constitutional: She is oriented to person, place, and time. She appears well-developed and well-nourished.  Non-toxic appearance. She does not  appear ill. No distress.  HENT:  Head: Normocephalic and atraumatic.  Right Ear: External ear normal.  Left Ear: External ear normal.  Nose: Nose normal. No mucosal edema or rhinorrhea.  Mouth/Throat: Oropharynx is clear and moist and mucous membranes are normal. No dental abscesses or uvula swelling.  Eyes: Conjunctivae and EOM are normal. Pupils are equal, round, and reactive to light.  Neck: Normal range of motion and full passive range of motion without pain. Neck supple.  Cardiovascular: Normal rate, regular rhythm and normal heart sounds. Exam reveals no gallop and no friction rub.  No murmur heard. Pulmonary/Chest: Effort normal and breath sounds normal. No respiratory distress. She has no wheezes. She has no rhonchi. She has no rales. She exhibits no tenderness and no crepitus.  Abdominal: Soft. Normal appearance and bowel sounds are  normal. She exhibits no distension. There is no tenderness. There is no rebound and no guarding.  Musculoskeletal: Normal range of motion. She exhibits no edema or tenderness.  Moves all extremities well.   Neurological: She is alert and oriented to person, place, and time. She has normal strength. No cranial nerve deficit.  Skin: Skin is warm, dry and intact. No rash noted. No erythema. No pallor.  + piloerection  Psychiatric: She has a normal mood and affect. Her speech is normal and behavior is normal. Her mood appears not anxious.  Nursing note and vitals reviewed.    ED Treatments / Results  Labs (all labs ordered are listed, but only abnormal results are displayed) Results for orders placed or performed during the hospital encounter of 05/15/17  Urinalysis, Routine w reflex microscopic  Result Value Ref Range   Color, Urine COLORLESS (A) YELLOW   APPearance CLEAR CLEAR   Specific Gravity, Urine 1.004 (L) 1.005 - 1.030   pH 7.0 5.0 - 8.0   Glucose, UA NEGATIVE NEGATIVE mg/dL   Hgb urine dipstick MODERATE (A) NEGATIVE   Bilirubin Urine NEGATIVE NEGATIVE   Ketones, ur NEGATIVE NEGATIVE mg/dL   Protein, ur NEGATIVE NEGATIVE mg/dL   Nitrite NEGATIVE NEGATIVE   Leukocytes, UA NEGATIVE NEGATIVE   RBC / HPF 0-5 0 - 5 RBC/hpf   WBC, UA 0-5 0 - 5 WBC/hpf   Bacteria, UA NONE SEEN NONE SEEN   Squamous Epithelial / LPF 0-5 (A) NONE SEEN  CBC with Differential  Result Value Ref Range   WBC 6.8 4.0 - 10.5 K/uL   RBC 4.45 3.87 - 5.11 MIL/uL   Hemoglobin 12.2 12.0 - 15.0 g/dL   HCT 16.1 09.6 - 04.5 %   MCV 83.6 78.0 - 100.0 fL   MCH 27.4 26.0 - 34.0 pg   MCHC 32.8 30.0 - 36.0 g/dL   RDW 40.9 81.1 - 91.4 %   Platelets 261 150 - 400 K/uL   Neutrophils Relative % 50 %   Neutro Abs 3.4 1.7 - 7.7 K/uL   Lymphocytes Relative 41 %   Lymphs Abs 2.8 0.7 - 4.0 K/uL   Monocytes Relative 7 %   Monocytes Absolute 0.5 0.1 - 1.0 K/uL   Eosinophils Relative 2 %   Eosinophils Absolute 0.1 0.0 -  0.7 K/uL   Basophils Relative 0 %   Basophils Absolute 0.0 0.0 - 0.1 K/uL  Lipase, blood  Result Value Ref Range   Lipase 25 11 - 51 U/L  Comprehensive metabolic panel  Result Value Ref Range   Sodium 140 135 - 145 mmol/L   Potassium 3.3 (L) 3.5 - 5.1 mmol/L   Chloride  103 101 - 111 mmol/L   CO2 26 22 - 32 mmol/L   Glucose, Bld 106 (H) 65 - 99 mg/dL   BUN 12 6 - 20 mg/dL   Creatinine, Ser 9.560.63 0.44 - 1.00 mg/dL   Calcium 9.2 8.9 - 21.310.3 mg/dL   Total Protein 7.6 6.5 - 8.1 g/dL   Albumin 4.3 3.5 - 5.0 g/dL   AST 19 15 - 41 U/L   ALT 13 (L) 14 - 54 U/L   Alkaline Phosphatase 68 38 - 126 U/L   Total Bilirubin 1.0 0.3 - 1.2 mg/dL   GFR calc non Af Amer >60 >60 mL/min   GFR calc Af Amer >60 >60 mL/min   Anion gap 11 5 - 15  POC Urine Pregnancy, ED ([facility])  Result Value Ref Range   Preg Test, Ur NEGATIVE NEGATIVE   Laboratory interpretation all normal except mild hypokalemia    EKG  EKG Interpretation None       Radiology No results found.  Procedures Procedures (including critical care time)  Medications Ordered in ED Medications  sodium chloride 0.9 % bolus 1,000 mL (0 mLs Intravenous Stopped 05/16/17 0152)  sodium chloride 0.9 % bolus 1,000 mL (0 mLs Intravenous Stopped 05/16/17 0054)  ondansetron (ZOFRAN) injection 4 mg (4 mg Intravenous Given 05/15/17 2350)     Initial Impression / Assessment and Plan / ED Course  I have reviewed the triage vital signs and the nursing notes.  Pertinent labs & imaging results that were available during my care of the patient were reviewed by me and considered in my medical decision making (see chart for details).     Pt was given IV fluids and IV nausea meds.   Recheck at 2:30 AM patient reports she feels much better, her nausea is gone.  She is having urinary output.  She does not want to try an oral fluid challenge, she states she wants to go home.  We discussed her test results which only showed a mild  hypokalemia most likely from her vomiting.  This should correct with normal diet.   Final Clinical Impressions(s) / ED Diagnoses   Final diagnoses:  Non-intractable vomiting with nausea, unspecified vomiting type  Hypokalemia    ED Discharge Orders        Ordered    ondansetron (ZOFRAN) 4 MG tablet  Every 8 hours PRN     05/16/17 0242      Plan discharge  Devoria AlbeIva Que Meneely, MD, [facility]    Keaja Reaume, MD 05/16/17 [phone]

## 2017-05-16 LAB — COMPREHENSIVE METABOLIC PANEL
ALBUMIN: 4.3 g/dL (ref 3.5–5.0)
ALK PHOS: 68 U/L (ref 38–126)
ALT: 13 U/L — AB (ref 14–54)
AST: 19 U/L (ref 15–41)
Anion gap: 11 (ref 5–15)
BUN: 12 mg/dL (ref 6–20)
CALCIUM: 9.2 mg/dL (ref 8.9–10.3)
CO2: 26 mmol/L (ref 22–32)
CREATININE: 0.63 mg/dL (ref 0.44–1.00)
Chloride: 103 mmol/L (ref 101–111)
GFR calc Af Amer: >60 mL/min (ref >60)
GFR calc non Af Amer: >60 mL/min (ref >60)
GLUCOSE: 106 mg/dL — AB (ref 65–99)
Potassium: 3.3 mmol/L — ABNORMAL LOW (ref 3.5–5.1)
SODIUM: 140 mmol/L (ref 135–145)
Total Bilirubin: 1 mg/dL (ref 0.3–1.2)
Total Protein: 7.6 g/dL (ref 6.5–8.1)

## 2017-05-16 LAB — URINALYSIS, ROUTINE W REFLEX MICROSCOPIC
Bacteria, UA: NONE SEEN
Bilirubin Urine: NEGATIVE
GLUCOSE, UA: NEGATIVE mg/dL
Ketones, ur: NEGATIVE mg/dL
LEUKOCYTES UA: NEGATIVE
Nitrite: NEGATIVE
PH: 7 (ref 5.0–8.0)
Protein, ur: NEGATIVE mg/dL
Specific Gravity, Urine: 1.004 — ABNORMAL LOW (ref 1.005–1.030)

## 2017-05-16 LAB — LIPASE, BLOOD: Lipase: 25 U/L (ref 11–51)

## 2017-05-16 LAB — POC URINE PREG, ED: Preg Test, Ur: NEGATIVE

## 2017-05-16 MED ORDER — ONDANSETRON HCL 4 MG PO TABS: 4.0000 mg | ORAL_TABLET | Freq: Three times a day (TID) | ORAL | 0 refills | Status: DC | PRN

## 2017-05-16 NOTE — Discharge Instructions (Signed)
Drink plenty of fluids (clear liquids) then start a bland diet later this morning such as toast, crackers, jello, [employer] chicken noodle soup. Use the zofran for nausea or vomiting.  Recheck if you get worse again. ° °

## 2018-02-23 ENCOUNTER — Other Ambulatory Visit: Payer: Self-pay

## 2018-02-23 ENCOUNTER — Emergency Department (HOSPITAL_COMMUNITY)
Admission: EM | Admit: 2018-02-23 | Discharge: 2018-02-23 | Disposition: A | Discharge status: Home / Self Care | Payer: Medicaid Other | Attending: Emergency Medicine | Admitting: Emergency Medicine

## 2018-02-23 ENCOUNTER — Encounter (HOSPITAL_COMMUNITY): Payer: Self-pay | Admitting: Emergency Medicine

## 2018-02-23 DIAGNOSIS — F17210 Nicotine dependence, cigarettes, uncomplicated: Secondary | ICD-10-CM | POA: Insufficient documentation

## 2018-02-23 DIAGNOSIS — A599 Trichomoniasis, unspecified: Secondary | ICD-10-CM | POA: Insufficient documentation

## 2018-02-23 DIAGNOSIS — N898 Other specified noninflammatory disorders of vagina: Secondary | ICD-10-CM | POA: Diagnosis not present

## 2018-02-23 DIAGNOSIS — Z202 Contact with and (suspected) exposure to infections with a predominantly sexual mode of transmission: Secondary | ICD-10-CM | POA: Diagnosis present

## 2018-02-23 LAB — URINALYSIS, ROUTINE W REFLEX MICROSCOPIC
Bilirubin Urine: NEGATIVE
Glucose, UA: NEGATIVE mg/dL
Ketones, ur: NEGATIVE mg/dL
Nitrite: NEGATIVE
Protein, ur: 30 mg/dL — AB
SPECIFIC GRAVITY, URINE: 1.029 (ref 1.005–1.030)
pH: 5 (ref 5.0–8.0)

## 2018-02-23 LAB — WET PREP, GENITAL
Clue Cells Wet Prep HPF POC: NONE SEEN
SPERM: NONE SEEN
YEAST WET PREP: NONE SEEN

## 2018-02-23 LAB — PREGNANCY, URINE: Preg Test, Ur: NEGATIVE

## 2018-02-23 MED ORDER — METRONIDAZOLE 500 MG PO TABS: 2000.0000 mg | ORAL_TABLET | Freq: Once | ORAL | 0 refills | Status: AC

## 2018-02-23 MED ORDER — CEFTRIAXONE SODIUM 250 MG IJ SOLR
250.0000 mg | Freq: Once | INTRAMUSCULAR | Status: AC
  Administered 2018-02-23: 250 mg via INTRAMUSCULAR
  Filled 2018-02-23: qty 250

## 2018-02-23 MED ORDER — AZITHROMYCIN 250 MG PO TABS
1000.0000 mg | ORAL_TABLET | Freq: Once | ORAL | Status: AC
  Administered 2018-02-23: 1000 mg via ORAL
  Filled 2018-02-23: qty 4

## 2018-02-23 MED ORDER — LIDOCAINE HCL (PF) 1 % IJ SOLN
INTRAMUSCULAR | Status: AC
  Filled 2018-02-23: qty 5

## 2018-02-23 NOTE — ED Triage Notes (Signed)
Used mono stat to treat for yeast with out relief.

## 2018-02-23 NOTE — ED Provider Notes (Signed)
[facility] EMERGENCY DEPARTMENT Provider Note   CSN: [id number] Arrival date & time: 02/23/18  1750     History   Chief Complaint No chief complaint on file.   HPI Donna Miranda is a 37 y.o. female.  HPI  Donna Miranda is a 37 y.o. female who presents to the Emergency Department complaining of vaginal itching and irritation with a discharge.  Symptoms have been present for 3 days.  She reports history of frequent yeast infections and she tried an over-the-counter Monistat without relief.  She does admit to unprotected intercourse but with same long-term partner.  She denies abnormal vaginal bleeding, abdominal pain, nausea or vomiting, fever or chills, for dysuria.  History reviewed. No pertinent past medical history.  Patient Active Problem List   Diagnosis Date Noted  . BACK PAIN 09/22/2009  . CERVICAL SPASM 09/22/2009    Past Surgical History:  Procedure Laterality Date  . TUBAL LIGATION       OB History   None      Home Medications    Prior to Admission medications   Medication Sig Start Date End Date Taking? Authorizing Provider  diphenhydramine-acetaminophen (TYLENOL PM) 25-500 MG TABS tablet Take 1-2 tablets by mouth at bedtime as needed.    [provider]  ondansetron (ZOFRAN) 4 MG tablet Take 1 tablet (4 mg total) every 8 (eight) hours as needed by mouth. 05/16/17   Devoria AlbeKnapp, Iva, MD    Family History Family History  Problem Relation Age of Onset  . Diabetes Mother   . Diabetes Father     Social History Social History   Tobacco Use  . Smoking status: Current Every Day Smoker    Packs/day: 0.50    Types: Cigarettes  . Smokeless tobacco: Never Used  Substance Use Topics  . Alcohol use: Yes    Comment: rarely  . Drug use: No     Allergies   Patient has no known allergies.   Review of Systems Review of Systems  Constitutional: Negative for activity change, appetite change, chills and fever.  Respiratory: Negative for shortness of  breath.   Cardiovascular: Negative for chest pain.  Gastrointestinal: Negative for abdominal pain, blood in stool, nausea and vomiting.  Genitourinary: Positive for vaginal discharge. Negative for decreased urine volume, difficulty urinating, dysuria, flank pain, genital sores, menstrual problem, pelvic pain and vaginal bleeding.  Musculoskeletal: Negative for back pain.  Skin: Negative for color change and rash.  Neurological: Negative for dizziness, weakness and numbness.  Hematological: Negative for adenopathy.  All other systems reviewed and are negative.    Physical Exam Updated Vital Signs BP 129/85 (BP Location: Right Arm)   Pulse 75   Temp 97.7 F (36.5 C) (Oral)   Resp 16   Ht 5\' 7"  (1.702 m)   LMP 02/15/2018   SpO2 100%   BMI 25.06 kg/m   Physical Exam  Constitutional: She appears well-developed. No distress.  HENT:  Mouth/Throat: Oropharynx is clear and moist.  Cardiovascular: Normal rate, regular rhythm and intact distal pulses.  Pulmonary/Chest: Effort normal and breath sounds normal. No respiratory distress.  Abdominal: Soft. She exhibits no distension. There is no tenderness. There is no guarding.  Genitourinary: Uterus normal. There is no rash or tenderness on the right labia. There is no rash or tenderness on the left labia. Cervix exhibits no motion tenderness and no friability. Right adnexum displays no mass and no tenderness. Left adnexum displays no mass and no tenderness. No tenderness or bleeding  in the vagina. No foreign body in the vagina. Vaginal discharge found.  Genitourinary Comments: Pelvic exam assisted by nursing staff.  Creamy vaginal discharge present.  No adnexal masses or tenderness.  No cervical motion tenderness.  External genitalia is normal-appearing.  Musculoskeletal: Normal range of motion. She exhibits no tenderness.  Neurological: She is alert. No sensory deficit.  Skin: Skin is warm. Capillary refill takes less than 2 seconds. No rash  noted.  Psychiatric: She has a normal mood and affect.  Nursing note and vitals reviewed.    ED Treatments / Results  Labs (all labs ordered are listed, but only abnormal results are displayed) Labs Reviewed  WET PREP, GENITAL - Abnormal; Notable for the following components:      Result Value   Trich, Wet Prep PRESENT (*)    WBC, Wet Prep HPF POC MODERATE (*)    All other components within normal limits  URINALYSIS, ROUTINE W REFLEX MICROSCOPIC - Abnormal; Notable for the following components:   APPearance HAZY (*)    Hgb urine dipstick MODERATE (*)    Protein, ur 30 (*)    Leukocytes, UA MODERATE (*)    Bacteria, UA RARE (*)    All other components within normal limits  PREGNANCY, URINE  GC/CHLAMYDIA PROBE AMP ([place]) NOT AT [facility]    EKG None  Radiology No results found.  Procedures Procedures (including critical care time)  Medications Ordered in ED Medications  cefTRIAXone (ROCEPHIN) injection 250 mg (has no administration in time range)  azithromycin (ZITHROMAX) tablet 1,000 mg (has no administration in time range)     Initial Impression / Assessment and Plan / ED Course  I have reviewed the triage vital signs and the nursing notes.  Pertinent labs & imaging results that were available during my care of the patient were reviewed by me and considered in my medical decision making (see chart for details).     Pt well appearing.  abd is soft, non-tender.  No concerning sx's for PID or TOA.  GC and Chlamydia cultures are pending.  Urinalysis shows moderate leukocytes and rare bacteria with 11-20 squamous epithelial cells which is felt to be a contaminant.  Urine cultures also pending.  Admits to possible exposure to STD and requests prophylactic treatment.  Will tx tonight with IM rocephin and po Zithromax and rx written for Flagyl.  Return precautions discussed  Final Clinical Impressions(s) / ED Diagnoses   Final diagnoses:  Trichomoniasis  More  ED  Discharge Orders    None       Pauline Ausriplett, Cheyanna Strick, Cordelia Poche-C 02/25/18 1832    Bethann BerkshireZammit, Joseph, MD 02/28/18 1205

## 2018-02-23 NOTE — Discharge Instructions (Addendum)
You have been given the medication this evening as a preventative for gonorrhea and chlamydia and a prescription written for treatment of trichomonas.  Be sure to take the prescription as a single dose tomorrow.  All sexual partners will need to be tested and/or treated which can be done at the health department.  Return to the ER for any worsening symptoms.  You will be notified of any positive culture results.

## 2018-02-23 NOTE — ED Triage Notes (Signed)
On set 3 days ago, Vaginal discomfort, white and yellow discharge

## 2018-02-24 LAB — GC/CHLAMYDIA PROBE AMP (~~LOC~~) NOT AT ARMC
Chlamydia: NEGATIVE
NEISSERIA GONORRHEA: NEGATIVE

## 2018-02-25 LAB — URINE CULTURE

## 2018-03-11 ENCOUNTER — Other Ambulatory Visit: Payer: Self-pay

## 2018-03-11 ENCOUNTER — Encounter (HOSPITAL_COMMUNITY): Payer: Self-pay | Admitting: Emergency Medicine

## 2018-03-11 ENCOUNTER — Emergency Department (HOSPITAL_COMMUNITY)
Admission: EM | Admit: 2018-03-11 | Discharge: 2018-03-12 | Disposition: A | Discharge status: Home / Self Care | Payer: Medicaid Other | Attending: Emergency Medicine | Admitting: Emergency Medicine

## 2018-03-11 DIAGNOSIS — F17210 Nicotine dependence, cigarettes, uncomplicated: Secondary | ICD-10-CM | POA: Insufficient documentation

## 2018-03-11 DIAGNOSIS — K149 Disease of tongue, unspecified: Secondary | ICD-10-CM | POA: Diagnosis not present

## 2018-03-11 NOTE — ED Triage Notes (Signed)
Pt states her tongue has felt like she had a hair in her mouth and has had discoloration x 5 days

## 2018-03-12 MED ORDER — NYSTATIN 100000 UNIT/ML MT SUSP: 500000.0000 [IU] | Freq: Four times a day (QID) | OROMUCOSAL | 0 refills | Status: AC

## 2018-03-12 NOTE — Discharge Instructions (Signed)
Use the medication as directed.  Follow up with your primary doctor for recheck if needed.

## 2018-03-12 NOTE — ED Provider Notes (Signed)
[facility] EMERGENCY DEPARTMENT Provider Note   CSN: [id number] Arrival date & time: 03/11/18  2235     History   Chief Complaint Chief Complaint  Patient presents with  . tongue discoloration    HPI Donna Miranda is a 37 y.o. female.  HPI  Donna Miranda is a 37 y.o. female who presents to the Emergency Department complaining of discoloration of her tongue. Symptoms have been present for 5 days.  She describes a yellow discoloration of her tongue and a sensation of feeling like she has a hair in her mouth.  She notes taking antibiotics 2 weeks ago for a trichomonas infection, but no other new foods or medications.  She denies pain or bleeding of her tongue, difficulty swallowing, dental pain, swelling or lesions of her mouth.  She denies history of diabetes or HIV.   History reviewed. No pertinent past medical history.  Patient Active Problem List   Diagnosis Date Noted  . BACK PAIN 09/22/2009  . CERVICAL SPASM 09/22/2009    Past Surgical History:  Procedure Laterality Date  . TUBAL LIGATION       OB History   None      Home Medications    Prior to Admission medications   Medication Sig Start Date End Date Taking? Authorizing Provider  diphenhydramine-acetaminophen (TYLENOL PM) 25-500 MG TABS tablet Take 1-2 tablets by mouth at bedtime as needed.    [provider]  nystatin (MYCOSTATIN) 100000 UNIT/ML suspension Use as directed 5 mLs (500,000 Units total) in the mouth or throat 4 (four) times daily. Swish and spit 03/12/18   Cherree Conerly, PA-C  ondansetron (ZOFRAN) 4 MG tablet Take 1 tablet (4 mg total) every 8 (eight) hours as needed by mouth. 05/16/17   Devoria Albe, MD    Family History Family History  Problem Relation Age of Onset  . Diabetes Mother   . Diabetes Father     Social History Social History   Tobacco Use  . Smoking status: Current Every Day Smoker    Packs/day: 0.50    Types: Cigarettes  . Smokeless tobacco: Never Used    Substance Use Topics  . Alcohol use: Yes    Comment: rarely  . Drug use: No     Allergies   Patient has no known allergies.   Review of Systems Review of Systems  Constitutional: Negative for activity change, appetite change, chills and fever.  HENT: Negative for congestion, ear pain, facial swelling, mouth sores, sore throat, trouble swallowing and voice change.        Discoloration of her tongue  Respiratory: Negative for cough and shortness of breath.   Gastrointestinal: Negative for abdominal pain, nausea and vomiting.  Skin: Negative for color change and rash.  Allergic/Immunologic: Negative for food allergies and immunocompromised state.  Neurological: Negative for speech difficulty, numbness and headaches.  Hematological: Negative for adenopathy.  All other systems reviewed and are negative.    Physical Exam Updated Vital Signs BP (!) 116/93 (BP Location: Right Arm)   Pulse 82   Temp 98.1 F (36.7 C) (Oral)   Resp 14   Ht 5\' 7"  (1.702 m)   Wt 74.8 kg   LMP 02/15/2018   SpO2 100%   BMI 25.84 kg/m   Physical Exam  Constitutional: She appears well-nourished. No distress.  HENT:  Head: Atraumatic.  Yellow discoloration of the posterior tongue.  Removable with tongue depressor.  No leukoplakia.  No edema, uvula is midline and nonedematous.  Neck:  Normal range of motion. Neck supple. No JVD present.  Cardiovascular: Normal rate and regular rhythm.  Pulmonary/Chest: Effort normal and breath sounds normal. No respiratory distress.  Musculoskeletal: Normal range of motion.  Lymphadenopathy:    She has no cervical adenopathy.  Neurological: She is alert.  Skin: Skin is warm. Capillary refill takes less than 2 seconds. No rash noted.  Psychiatric: She has a normal mood and affect.  Nursing note and vitals reviewed.    ED Treatments / Results  Labs (all labs ordered are listed, but only abnormal results are displayed) Labs Reviewed - No data to  display  EKG None  Radiology No results found.  Procedures Procedures (including critical care time)  Medications Ordered in ED Medications - No data to display   Initial Impression / Assessment and Plan / ED Course  I have reviewed the triage vital signs and the nursing notes.  Pertinent labs & imaging results that were available during my care of the patient were reviewed by me and considered in my medical decision making (see chart for details).     Patient well-appearing.  Vitals reviewed.  Airway is patent without edema.  Mild yellow discoloration of the posterior tongue without oral lesions.  Patient appropriate for discharge home, agrees to proper oral hygiene and prescription for Mycostatin.  Final Clinical Impressions(s) / ED Diagnoses   Final diagnoses:  Tongue irritation    ED Discharge Orders         Ordered    nystatin (MYCOSTATIN) 100000 UNIT/ML suspension  4 times daily     03/12/18 0030           Pauline Aus, PA-C 03/12/18 1759    Devoria Albe, MD 03/19/18 2300

## 2018-12-08 ENCOUNTER — Emergency Department (HOSPITAL_COMMUNITY): Payer: No Typology Code available for payment source

## 2018-12-08 ENCOUNTER — Other Ambulatory Visit: Payer: Self-pay

## 2018-12-08 ENCOUNTER — Emergency Department (HOSPITAL_COMMUNITY)
Admission: EM | Admit: 2018-12-08 | Discharge: 2018-12-08 | Disposition: A | Discharge status: Home / Self Care | Payer: No Typology Code available for payment source | Attending: Emergency Medicine | Admitting: Emergency Medicine

## 2018-12-08 DIAGNOSIS — S299XXA Unspecified injury of thorax, initial encounter: Secondary | ICD-10-CM | POA: Diagnosis not present

## 2018-12-08 DIAGNOSIS — S99921A Unspecified injury of right foot, initial encounter: Secondary | ICD-10-CM | POA: Diagnosis not present

## 2018-12-08 DIAGNOSIS — M549 Dorsalgia, unspecified: Secondary | ICD-10-CM | POA: Diagnosis not present

## 2018-12-08 DIAGNOSIS — M7918 Myalgia, other site: Secondary | ICD-10-CM | POA: Diagnosis present

## 2018-12-08 DIAGNOSIS — M545 Low back pain: Secondary | ICD-10-CM | POA: Diagnosis not present

## 2018-12-08 DIAGNOSIS — M546 Pain in thoracic spine: Secondary | ICD-10-CM | POA: Diagnosis not present

## 2018-12-08 DIAGNOSIS — S20212A Contusion of left front wall of thorax, initial encounter: Secondary | ICD-10-CM | POA: Diagnosis not present

## 2018-12-08 DIAGNOSIS — M25561 Pain in right knee: Secondary | ICD-10-CM | POA: Diagnosis not present

## 2018-12-08 DIAGNOSIS — S8992XA Unspecified injury of left lower leg, initial encounter: Secondary | ICD-10-CM | POA: Diagnosis not present

## 2018-12-08 DIAGNOSIS — M79671 Pain in right foot: Secondary | ICD-10-CM | POA: Diagnosis not present

## 2018-12-08 DIAGNOSIS — M25511 Pain in right shoulder: Secondary | ICD-10-CM | POA: Diagnosis not present

## 2018-12-08 DIAGNOSIS — F17210 Nicotine dependence, cigarettes, uncomplicated: Secondary | ICD-10-CM | POA: Diagnosis not present

## 2018-12-08 DIAGNOSIS — S4991XA Unspecified injury of right shoulder and upper arm, initial encounter: Secondary | ICD-10-CM | POA: Diagnosis not present

## 2018-12-08 DIAGNOSIS — S8991XA Unspecified injury of right lower leg, initial encounter: Secondary | ICD-10-CM | POA: Diagnosis not present

## 2018-12-08 DIAGNOSIS — Z79899 Other long term (current) drug therapy: Secondary | ICD-10-CM | POA: Diagnosis not present

## 2018-12-08 DIAGNOSIS — M25562 Pain in left knee: Secondary | ICD-10-CM | POA: Diagnosis not present

## 2018-12-08 DIAGNOSIS — S3992XA Unspecified injury of lower back, initial encounter: Secondary | ICD-10-CM | POA: Diagnosis not present

## 2018-12-08 MED ORDER — OXYCODONE-ACETAMINOPHEN 5-325 MG PO TABS
1.0000 | ORAL_TABLET | Freq: Once | ORAL | Status: AC
  Administered 2018-12-08: 1 via ORAL
  Filled 2018-12-08: qty 1

## 2018-12-08 MED ORDER — ACETAMINOPHEN 325 MG PO TABS: 650.0000 mg | ORAL_TABLET | Freq: Once | ORAL | Status: DC

## 2018-12-08 MED ORDER — ONDANSETRON 4 MG PO TBDP
4.0000 mg | ORAL_TABLET | Freq: Once | ORAL | Status: AC
  Administered 2018-12-08: 19:00:00 4 mg via ORAL
  Filled 2018-12-08: qty 1
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
## 2018-12-08 NOTE — ED Notes (Signed)
Patient verbalizes understanding of discharge instructions. Opportunity for questioning and answering were provided.  patient discharged from ED.  

## 2018-12-08 NOTE — Discharge Instructions (Signed)

## 2020-01-19 DIAGNOSIS — M549 Dorsalgia, unspecified: Secondary | ICD-10-CM | POA: Diagnosis not present

## 2020-01-19 DIAGNOSIS — M533 Sacrococcygeal disorders, not elsewhere classified: Secondary | ICD-10-CM | POA: Diagnosis not present

## 2020-01-22 ENCOUNTER — Encounter (HOSPITAL_COMMUNITY): Payer: Self-pay | Admitting: *Deleted

## 2020-01-22 ENCOUNTER — Emergency Department (HOSPITAL_COMMUNITY)
Admission: EM | Admit: 2020-01-22 | Discharge: 2020-01-22 | Disposition: A | Discharge status: Home / Self Care | Payer: Medicaid Other | Attending: Emergency Medicine | Admitting: Emergency Medicine

## 2020-01-22 ENCOUNTER — Other Ambulatory Visit: Payer: Self-pay

## 2020-01-22 DIAGNOSIS — F17210 Nicotine dependence, cigarettes, uncomplicated: Secondary | ICD-10-CM | POA: Diagnosis not present

## 2020-01-22 DIAGNOSIS — R519 Headache, unspecified: Secondary | ICD-10-CM | POA: Diagnosis not present

## 2020-01-22 DIAGNOSIS — R112 Nausea with vomiting, unspecified: Secondary | ICD-10-CM | POA: Diagnosis not present

## 2020-01-22 LAB — COMPREHENSIVE METABOLIC PANEL
ALT: 14 U/L (ref 0–44)
AST: 18 U/L (ref 15–41)
Albumin: 3.9 g/dL (ref 3.5–5.0)
Alkaline Phosphatase: 59 U/L (ref 38–126)
Anion gap: 8 (ref 5–15)
BUN: 11 mg/dL (ref 6–20)
CO2: 27 mmol/L (ref 22–32)
Calcium: 9 mg/dL (ref 8.9–10.3)
Chloride: 105 mmol/L (ref 98–111)
Creatinine, Ser: 0.62 mg/dL (ref 0.44–1.00)
GFR calc Af Amer: >60 mL/min (ref >60)
GFR calc non Af Amer: >60 mL/min (ref >60)
Glucose, Bld: 102 mg/dL — ABNORMAL HIGH (ref 70–99)
Potassium: 4.8 mmol/L (ref 3.5–5.1)
Sodium: 140 mmol/L (ref 135–145)
Total Bilirubin: 0.6 mg/dL (ref 0.3–1.2)
Total Protein: 7.3 g/dL (ref 6.5–8.1)

## 2020-01-22 LAB — CBC WITH DIFFERENTIAL/PLATELET
Abs Immature Granulocytes: 0.03 10*3/uL (ref 0.00–0.07)
Basophils Absolute: 0 10*3/uL (ref 0.0–0.1)
Basophils Relative: 0 %
Eosinophils Absolute: 0.1 10*3/uL (ref 0.0–0.5)
Eosinophils Relative: 1 %
HCT: 40.2 % (ref 36.0–46.0)
Hemoglobin: 12.3 g/dL (ref 12.0–15.0)
Immature Granulocytes: 0 %
Lymphocytes Relative: 16 %
Lymphs Abs: 1.4 10*3/uL (ref 0.7–4.0)
MCH: 26.8 pg (ref 26.0–34.0)
MCHC: 30.6 g/dL (ref 30.0–36.0)
MCV: 87.6 fL (ref 80.0–100.0)
Monocytes Absolute: 0.3 10*3/uL (ref 0.1–1.0)
Monocytes Relative: 4 %
Neutro Abs: 6.5 10*3/uL (ref 1.7–7.7)
Neutrophils Relative %: 79 %
Platelets: 283 10*3/uL (ref 150–400)
RBC: 4.59 MIL/uL (ref 3.87–5.11)
RDW: 14.7 % (ref 11.5–15.5)
WBC: 8.3 10*3/uL (ref 4.0–10.5)
nRBC: 0 % (ref 0.0–0.2)

## 2020-01-22 LAB — LIPASE, BLOOD: Lipase: 22 U/L (ref 11–51)

## 2020-01-22 LAB — HCG, QUANTITATIVE, PREGNANCY: hCG, Beta Chain, Quant, S: <1 m[IU]/mL (ref <5)

## 2020-01-22 MED ORDER — ONDANSETRON HCL 4 MG/2ML IJ SOLN
4.0000 mg | Freq: Once | INTRAMUSCULAR | Status: AC
  Administered 2020-01-22: 4 mg via INTRAVENOUS
  Filled 2020-01-22: qty 2

## 2020-01-22 MED ORDER — ONDANSETRON HCL 4 MG PO TABS: 4.0000 mg | ORAL_TABLET | Freq: Four times a day (QID) | ORAL | 0 refills | Status: AC

## 2020-01-22 MED ORDER — SODIUM CHLORIDE 0.9 % IV BOLUS
1000.0000 mL | Freq: Once | INTRAVENOUS | Status: AC
  Administered 2020-01-22: 1000 mL via INTRAVENOUS

## 2020-01-22 NOTE — Clinical Social Work Note (Signed)
Transition of Care [facility]) - Emergency Department Mini Assessment  Patient Details  Name: Donna Miranda MRN: [id number] Date of Birth: [birth year]  [facility] CM/SW Contact:    Sherie Don, LCSW Phone Number: 01/22/2020, 11:02 AM  Clinical Narrative: Patient is a 39 year old female who presented to the ED for emesis and weakness. Per chart review, patient has [place] [employer] [facility], but no PCP. CSW spoke with patient regarding PCPs that accept her managed Medicaid. Patient agreeable to PCP list. CSW met with patient in ED and reviewed PCP options with patient that are in-network with her plan. TOC signing off.  ED Mini Assessment: What brought you to the Emergency Department? : Emesis, weakness Barriers to Discharge: ED Barriers Resolved Barrier interventions: PCP resource list Means of departure: Car Interventions which prevented an admission or readmission: Other (must enter comment) (PCP resources)  Admission diagnosis:  emesis weakness Patient Active Problem List   Diagnosis Date Noted  . BACK PAIN 09/22/2009  . CERVICAL SPASM 09/22/2009   PCP:  Patient, No Pcp Per Pharmacy:   [facility] [address], [place] [address] Phone: [phone] Fax: [phone]

## 2020-01-22 NOTE — ED Provider Notes (Addendum)
[facility] EMERGENCY DEPARTMENT Provider Note   CSN: [id number] Arrival date & time: 01/22/20  2458     History Chief Complaint  Patient presents with  . Emesis    Donna Miranda is a 39 y.o. female.  HPI 39 year old female with no significant medical history presents to the ER with nausea and several episodes of nonbloody nonbilious vomiting which began this morning.  Patient states that she woke up this morning with a headache, nausea and vomiting.  She denies any fevers or chills.  She endorses some abdominal pain right before and after she vomits, however nothing localized.  She denies any dysuria.  No diarrhea or constipation.  No vision changes, weakness.  She denies any history of migraines, however per chart review patient has been seen here in the ER for headaches.  She has not eaten or had anything to drink today.  She has not taken anything for her symptoms.  Denies any recent alcohol or drug use.  She is sexually active, however states that she has had a tubal ligation.  She had a fish plate last night for dinner at a drive-through.    History reviewed. No pertinent past medical history.  Patient Active Problem List   Diagnosis Date Noted  . BACK PAIN 09/22/2009  . CERVICAL SPASM 09/22/2009    Past Surgical History:  Procedure Laterality Date  . TUBAL LIGATION       OB History   No obstetric history on file.     Family History  Problem Relation Age of Onset  . Diabetes Mother   . Diabetes Father     Social History   Tobacco Use  . Smoking status: Current Every Day Smoker    Packs/day: 0.50    Types: Cigarettes  . Smokeless tobacco: Never Used  Vaping Use  . Vaping Use: Never used  Substance Use Topics  . Alcohol use: Yes    Comment: rarely  . Drug use: No    Home Medications Prior to Admission medications   Medication Sig Start Date End Date Taking? Authorizing Provider  ibuprofen (ADVIL) 400 MG tablet Take 1 tablet by mouth every 8 (eight)  hours as needed. 01/19/20  Yes [provider]  methocarbamol (ROBAXIN) 500 MG tablet Take 1 tablet by mouth 2 (two) times daily. 01/19/20 01/29/20 Yes [provider]  nystatin (MYCOSTATIN) 100000 UNIT/ML suspension Use as directed 5 mLs (500,000 Units total) in the mouth or throat 4 (four) times daily. Swish and spit Patient not taking: Reported on 12/08/2018 03/12/18   Triplett, Tammy, PA-C  ondansetron (ZOFRAN) 4 MG tablet Take 1 tablet (4 mg total) by mouth every 6 (six) hours. 01/22/20   Mare Ferrari, PA-C    Allergies    Patient has no known allergies.  Review of Systems   Review of Systems  Constitutional: Negative for chills and fever.  HENT: Negative for ear pain and sore throat.   Eyes: Negative for pain and visual disturbance.  Respiratory: Negative for cough and shortness of breath.   Cardiovascular: Negative for chest pain and palpitations.  Gastrointestinal: Positive for nausea and vomiting. Negative for abdominal pain.  Genitourinary: Negative for dysuria and hematuria.  Musculoskeletal: Negative for arthralgias and back pain.  Skin: Negative for color change and rash.  Neurological: Positive for headaches. Negative for seizures and syncope.  All other systems reviewed and are negative.   Physical Exam Updated Vital Signs BP 127/74 (BP Location: Left Arm)   Pulse 73  Temp 98.1 F (36.7 C) (Oral)   Resp 18   Ht 5\' 7"  (1.702 m)   Wt 77.1 kg   LMP 01/11/2020   SpO2 100%   BMI 26.63 kg/m   Physical Exam Vitals and nursing note reviewed.  Constitutional:      General: She is not in acute distress.    Appearance: Normal appearance. She is well-developed. She is not ill-appearing, toxic-appearing or diaphoretic.  HENT:     Head: Normocephalic and atraumatic.     Mouth/Throat:     Mouth: Mucous membranes are moist.  Eyes:     Conjunctiva/sclera: Conjunctivae normal.     Pupils: Pupils are equal, round, and reactive to light.  Cardiovascular:       Rate and Rhythm: Normal rate and regular rhythm.     Pulses: Normal pulses.     Heart sounds: Normal heart sounds. No murmur heard.   Pulmonary:     Effort: Pulmonary effort is normal. No respiratory distress.     Breath sounds: Normal breath sounds.  Abdominal:     General: Abdomen is flat.     Palpations: Abdomen is soft.     Tenderness: There is no abdominal tenderness.     Comments: Soft nontender abdomen, no rebound or guarding.  Musculoskeletal:        General: Normal range of motion.     Cervical back: Normal range of motion and neck supple.  Skin:    General: Skin is warm and dry.     Capillary Refill: Capillary refill takes less than 2 seconds.  Neurological:     General: No focal deficit present.     Mental Status: She is alert and oriented to person, place, and time.     Cranial Nerves: No cranial nerve deficit.     Sensory: No sensory deficit.     Motor: No weakness.  Psychiatric:        Mood and Affect: Mood normal.        Behavior: Behavior normal.     ED Results / Procedures / Treatments   Labs (all labs ordered are listed, but only abnormal results are displayed) Labs Reviewed  COMPREHENSIVE METABOLIC PANEL - Abnormal; Notable for the following components:      Result Value   Glucose, Bld 102 (*)    All other components within normal limits  CBC WITH DIFFERENTIAL/PLATELET  LIPASE, BLOOD  HCG, QUANTITATIVE, PREGNANCY    EKG None  Radiology No results found.  Procedures Procedures (including critical care time)  Medications Ordered in ED Medications  sodium chloride 0.9 % bolus 1,000 mL (1,000 mLs Intravenous New Bag/Given 01/22/20 0943)  ondansetron (ZOFRAN) injection 4 mg (4 mg Intravenous Given 01/22/20 01/24/20)    ED Course  I have reviewed the triage vital signs and the nursing notes.  Pertinent labs & imaging results that were available during my care of the patient were reviewed by me and considered in my medical decision making (see  chart for details).    MDM Rules/Calculators/A&P                         39 year old female with several episodes of vomiting and nausea this morning On presentation, the patient is alert and oriented, nontoxic-appearing, no acute distress.  Vitals are overall very reassuring.  Physical exam without any abdominal tenderness, no focal neuro deficits, patient is resting comfortably in the ER bed.  CBC, CMP, lipase and beta-hCG without any abnormalities.  Patient was given Zofran and a liter of fluids here in the ED.  On reevaluation, she notes significant improvement in her nausea and headache.  Tolerated p.o. challenge well.  Will DC with additional Zofran.  Suspect possible food poisoning from fish.  Suspicion for appendicitis, diverticulitis, hypertensive urgency/emergency, cholecystitis, pancreatitis low.  Strict return precautions given.  Work note provided per patient request.  All the patient's questions have been answered to her satisfaction, she voices understanding is agreeable to this plan.  At this stage in the ED course, the patient has been medically screened and is stable for discharge.   Final Clinical Impression(s) / ED Diagnoses Final diagnoses:  Non-intractable vomiting with nausea, unspecified vomiting type    Rx / DC Orders ED Discharge Orders         Ordered    ondansetron (ZOFRAN) 4 MG tablet  Every 6 hours     Discontinue  Reprint     01/22/20 1052             Leone Brand 01/22/20 1100    Bethann Berkshire, MD 01/23/20 [phone]

## 2020-01-22 NOTE — ED Triage Notes (Signed)
Pt states she started vomiting this am and states she had a headache this am when she woke up

## 2020-01-22 NOTE — Discharge Instructions (Signed)
Your lab work today was overall reassuring.  Please drink plenty of fluids, and take Zofran as needed for nausea.  Return to the ER if your symptoms worsen.
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
## 2020-04-18 ENCOUNTER — Encounter: Payer: Self-pay | Admitting: Emergency Medicine

## 2020-04-18 ENCOUNTER — Ambulatory Visit
Admission: EM | Admit: 2020-04-18 | Discharge: 2020-04-18 | Disposition: A | Discharge status: Home / Self Care | Payer: Medicaid Other | Attending: Emergency Medicine | Admitting: Emergency Medicine

## 2020-04-18 ENCOUNTER — Other Ambulatory Visit: Payer: Self-pay

## 2020-04-18 DIAGNOSIS — Z23 Encounter for immunization: Secondary | ICD-10-CM | POA: Diagnosis not present

## 2020-04-18 DIAGNOSIS — S91331A Puncture wound without foreign body, right foot, initial encounter: Secondary | ICD-10-CM | POA: Diagnosis not present

## 2020-04-18 MED ORDER — IBUPROFEN 800 MG PO TABS: 800.0000 mg | ORAL_TABLET | Freq: Three times a day (TID) | ORAL | 0 refills | Status: AC

## 2020-04-18 MED ORDER — IBUPROFEN 800 MG PO TABS
800.0000 mg | ORAL_TABLET | Freq: Once | ORAL | Status: AC
  Administered 2020-04-18: 800 mg via ORAL

## 2020-04-18 MED ORDER — CEPHALEXIN 500 MG PO CAPS: 500.0000 mg | ORAL_CAPSULE | Freq: Four times a day (QID) | ORAL | 0 refills | Status: AC

## 2020-04-18 MED ORDER — TETANUS-DIPHTH-ACELL PERTUSSIS 5-2.5-18.5 LF-MCG/0.5 IM SUSP
0.5000 mL | Freq: Once | INTRAMUSCULAR | Status: AC
  Administered 2020-04-18: 0.5 mL via INTRAMUSCULAR

## 2020-04-18 NOTE — ED Triage Notes (Signed)
Stepped on a nail with RT foot today. Needs tetanus vaccine.

## 2020-04-18 NOTE — Discharge Instructions (Addendum)
Keep wound clean with warm water and mild soap May apply to layer of Neosporin Ace wrap was applied in office Keflex was prescribed take as directed Ibuprofen was prescribed for pain Follow-up with PCP Return to to ED for worsening symptoms
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
## 2021-04-14 DIAGNOSIS — J069 Acute upper respiratory infection, unspecified: Secondary | ICD-10-CM | POA: Diagnosis not present

## 2021-04-14 DIAGNOSIS — Z20822 Contact with and (suspected) exposure to covid-19: Secondary | ICD-10-CM | POA: Diagnosis not present

## 2021-04-14 DIAGNOSIS — B9789 Other viral agents as the cause of diseases classified elsewhere: Secondary | ICD-10-CM | POA: Diagnosis not present

## 2021-04-14 DIAGNOSIS — R197 Diarrhea, unspecified: Secondary | ICD-10-CM | POA: Diagnosis not present

## 2021-04-14 DIAGNOSIS — R059 Cough, unspecified: Secondary | ICD-10-CM | POA: Diagnosis not present

## 2021-04-14 DIAGNOSIS — J309 Allergic rhinitis, unspecified: Secondary | ICD-10-CM | POA: Diagnosis not present
